# Patient Record
Sex: Female | Born: 1990 | Race: Black or African American | Hispanic: No | Marital: Single | Smoking: Never smoker
Health system: Southern US, Community
[De-identification: ages and names within clinical notes are randomized; demographics above are authoritative.]

## PROBLEM LIST (undated history)

## (undated) ENCOUNTER — Ambulatory Visit

## (undated) DIAGNOSIS — E7800 Pure hypercholesterolemia, unspecified: Secondary | ICD-10-CM

## (undated) DIAGNOSIS — D573 Sickle-cell trait: Secondary | ICD-10-CM

## (undated) HISTORY — DX: Pure hypercholesterolemia, unspecified: E78.00

## (undated) HISTORY — DX: Sickle-cell trait: D57.3

---

## 2015-04-24 DIAGNOSIS — O139 Gestational [pregnancy-induced] hypertension without significant proteinuria, unspecified trimester: Secondary | ICD-10-CM

## 2017-03-17 HISTORY — PX: LIPOSUCTION: SHX10

## 2019-01-28 ENCOUNTER — Encounter (HOSPITAL_COMMUNITY): Payer: Self-pay | Admitting: Emergency Medicine

## 2019-01-28 ENCOUNTER — Other Ambulatory Visit: Payer: Self-pay

## 2019-01-28 ENCOUNTER — Ambulatory Visit (HOSPITAL_COMMUNITY)
Admission: EM | Admit: 2019-01-28 | Discharge: 2019-01-28 | Disposition: A | Discharge status: Home / Self Care | Payer: BC Managed Care – PPO | Attending: Urgent Care | Admitting: Urgent Care

## 2019-01-28 DIAGNOSIS — L03113 Cellulitis of right upper limb: Secondary | ICD-10-CM | POA: Diagnosis not present

## 2019-01-28 DIAGNOSIS — M25521 Pain in right elbow: Secondary | ICD-10-CM

## 2019-01-28 MED ORDER — NAPROXEN 500 MG PO TABS: 500.0000 mg | ORAL_TABLET | Freq: Two times a day (BID) | ORAL | 0 refills | Status: DC

## 2019-01-28 MED ORDER — DOXYCYCLINE HYCLATE 100 MG PO CAPS: 100.0000 mg | ORAL_CAPSULE | Freq: Two times a day (BID) | ORAL | 0 refills | Status: DC

## 2019-01-28 NOTE — ED Provider Notes (Signed)
  [place]   MRN: [id number] DOB: [birth year]  Subjective:   Sheri Gilbert is a 28 y.o. female presenting for several day history of right elbow pain, redness and swelling.  Believes that she either hit her elbow or suffered an insect sting while she was moving stuff.  She has not tried any medications for relief.  Denies taking chronic medications.    No Known Allergies  History reviewed. No pertinent past medical history.   Past Surgical History:  Procedure Laterality Date  . CESAREAN SECTION      No family history on file.  Social History   Tobacco Use  . Smoking status: Never Smoker  . Smokeless tobacco: Never Used  Substance Use Topics  . Alcohol use: Not Currently  . Drug use: Not Currently    ROS   Objective:   Vitals: BP 113/82   Pulse 89   Temp 98 F (36.7 C) (Temporal)   Resp 16   SpO2 100%   Physical Exam Constitutional:      General: She is not in acute distress.    Appearance: Normal appearance. She is well-developed. She is not ill-appearing.  HENT:     Head: Normocephalic and atraumatic.     Nose: Nose normal.     Mouth/Throat:     Mouth: Mucous membranes are moist.     Pharynx: Oropharynx is clear.  Eyes:     General: No scleral icterus.    Extraocular Movements: Extraocular movements intact.     Pupils: Pupils are equal, round, and reactive to light.  Cardiovascular:     Rate and Rhythm: Normal rate.  Pulmonary:     Effort: Pulmonary effort is normal.  Musculoskeletal:     Right elbow: She exhibits swelling. She exhibits normal range of motion, no effusion, no deformity and no laceration. Tenderness (over area depicted) found. No radial head, no medial epicondyle, no lateral epicondyle and no olecranon process tenderness noted.       Arms:  Skin:    General: Skin is warm and dry.  Neurological:     General: No focal deficit present.     Mental Status: She is alert and oriented to person, place, and time.   Psychiatric:        Mood and Affect: Mood normal.        Behavior: Behavior normal.      Assessment and Plan :   1. Cellulitis of right elbow   2. Right elbow pain     Will cover for cellulitis with doxycycline, use Naprosyn for pain and inflammation. Counseled patient on potential for adverse effects with medications prescribed/recommended today, ER and return-to-clinic precautions discussed, patient verbalized understanding.    Jaynee Eagles, PA-C 01/28/19 1729

## 2019-01-28 NOTE — ED Triage Notes (Signed)
PT has suspected insect bite to right elbow.

## 2019-01-28 NOTE — Discharge Instructions (Signed)
Please start taking doxycycline to address infection of your right elbow skin. Use warm compresses 3-5 times a day for 5-10 minutes for at least the next 3 days.

## 2019-03-27 ENCOUNTER — Emergency Department (HOSPITAL_COMMUNITY)
Admission: EM | Admit: 2019-03-27 | Discharge: 2019-03-27 | Disposition: A | Discharge status: Home / Self Care | Payer: BC Managed Care – PPO | Attending: Emergency Medicine | Admitting: Emergency Medicine

## 2019-03-27 ENCOUNTER — Emergency Department (HOSPITAL_COMMUNITY)
Admission: EM | Admit: 2019-03-27 | Discharge: 2019-03-27 | Disposition: A | Discharge status: Home / Self Care | Payer: Self-pay | Attending: Emergency Medicine | Admitting: Emergency Medicine

## 2019-03-27 ENCOUNTER — Encounter (HOSPITAL_COMMUNITY): Payer: Self-pay | Admitting: Emergency Medicine

## 2019-03-27 ENCOUNTER — Other Ambulatory Visit: Payer: Self-pay

## 2019-03-27 ENCOUNTER — Emergency Department (HOSPITAL_COMMUNITY): Payer: Self-pay

## 2019-03-27 DIAGNOSIS — R1012 Left upper quadrant pain: Secondary | ICD-10-CM | POA: Insufficient documentation

## 2019-03-27 DIAGNOSIS — R109 Unspecified abdominal pain: Secondary | ICD-10-CM | POA: Insufficient documentation

## 2019-03-27 DIAGNOSIS — Z5321 Procedure and treatment not carried out due to patient leaving prior to being seen by health care provider: Secondary | ICD-10-CM | POA: Insufficient documentation

## 2019-03-27 DIAGNOSIS — Z79899 Other long term (current) drug therapy: Secondary | ICD-10-CM | POA: Insufficient documentation

## 2019-03-27 LAB — CBC WITH DIFFERENTIAL/PLATELET
Abs Immature Granulocytes: 0.06 10*3/uL (ref 0.00–0.07)
Basophils Absolute: 0 10*3/uL (ref 0.0–0.1)
Basophils Relative: 0 %
Eosinophils Absolute: 0.2 10*3/uL (ref 0.0–0.5)
Eosinophils Relative: 2 %
HCT: 37.4 % (ref 36.0–46.0)
Hemoglobin: 12.1 g/dL (ref 12.0–15.0)
Immature Granulocytes: 1 %
Lymphocytes Relative: 24 %
Lymphs Abs: 2.8 10*3/uL (ref 0.7–4.0)
MCH: 28.1 pg (ref 26.0–34.0)
MCHC: 32.4 g/dL (ref 30.0–36.0)
MCV: 87 fL (ref 80.0–100.0)
Monocytes Absolute: 0.6 10*3/uL (ref 0.1–1.0)
Monocytes Relative: 6 %
Neutro Abs: 7.8 10*3/uL — ABNORMAL HIGH (ref 1.7–7.7)
Neutrophils Relative %: 67 %
Platelets: 303 10*3/uL (ref 150–400)
RBC: 4.3 MIL/uL (ref 3.87–5.11)
RDW: 12.6 % (ref 11.5–15.5)
WBC: 11.5 10*3/uL — ABNORMAL HIGH (ref 4.0–10.5)
nRBC: 0 % (ref 0.0–0.2)

## 2019-03-27 LAB — COMPREHENSIVE METABOLIC PANEL
ALT: 16 U/L (ref 0–44)
AST: 15 U/L (ref 15–41)
Albumin: 3.8 g/dL (ref 3.5–5.0)
Alkaline Phosphatase: 86 U/L (ref 38–126)
Anion gap: 9 (ref 5–15)
BUN: 8 mg/dL (ref 6–20)
CO2: 25 mmol/L (ref 22–32)
Calcium: 9.4 mg/dL (ref 8.9–10.3)
Chloride: 104 mmol/L (ref 98–111)
Creatinine, Ser: 0.68 mg/dL (ref 0.44–1.00)
GFR calc Af Amer: >60 mL/min (ref >60)
GFR calc non Af Amer: >60 mL/min (ref >60)
Glucose, Bld: 98 mg/dL (ref 70–99)
Potassium: 3.9 mmol/L (ref 3.5–5.1)
Sodium: 138 mmol/L (ref 135–145)
Total Bilirubin: 0.4 mg/dL (ref 0.3–1.2)
Total Protein: 7.3 g/dL (ref 6.5–8.1)

## 2019-03-27 LAB — URINALYSIS, ROUTINE W REFLEX MICROSCOPIC
Bilirubin Urine: NEGATIVE
Glucose, UA: NEGATIVE mg/dL
Hgb urine dipstick: NEGATIVE
Ketones, ur: NEGATIVE mg/dL
Leukocytes,Ua: NEGATIVE
Nitrite: NEGATIVE
Protein, ur: NEGATIVE mg/dL
Specific Gravity, Urine: 1.013 (ref 1.005–1.030)
pH: 6 (ref 5.0–8.0)

## 2019-03-27 LAB — I-STAT BETA HCG BLOOD, ED (MC, WL, AP ONLY): I-stat hCG, quantitative: <5 m[IU]/mL (ref <5)

## 2019-03-27 LAB — LIPASE, BLOOD: Lipase: 22 U/L (ref 11–51)

## 2019-03-27 MED ORDER — ESOMEPRAZOLE MAGNESIUM 40 MG PO CPDR: 40.0000 mg | DELAYED_RELEASE_CAPSULE | Freq: Every day | ORAL | 0 refills | Status: DC

## 2019-03-27 MED ORDER — ALUM & MAG HYDROXIDE-SIMETH 200-200-20 MG/5ML PO SUSP
30.0000 mL | Freq: Once | ORAL | Status: AC
  Administered 2019-03-27: 22:00:00 30 mL via ORAL
  Filled 2019-03-27: qty 30

## 2019-03-27 MED ORDER — LIDOCAINE VISCOUS HCL 2 % MT SOLN
15.0000 mL | Freq: Once | OROMUCOSAL | Status: AC
  Administered 2019-03-27: 15 mL via ORAL
  Filled 2019-03-27: qty 15

## 2019-03-27 NOTE — ED Provider Notes (Signed)
[facility]-EMERGENCY DEPT Provider Note   CSN: [id number] Arrival date & time: 03/27/19  2025     History Chief Complaint  Patient presents with  . Abdominal Pain    Sheri Gilbert is a 29 y.o. female.  29 year old female presents with complaint of left-sided abdominal pain onset yesterday, radiates to left back, similar pain intermittent for the past 3 years.  Associated with nausea, denies vomiting.  Denies fevers, chills, changes in bowel or bladder habits.  Patient states that she is been seen for this on multiple occasions and been told that it is constipation related.  Patient states that she is constipated at times but does not feel like this is what is causing her pain.  Has not been seen by GI.  No other complaints or concerns.        History reviewed. No pertinent past medical history.  There are no problems to display for this patient.   Past Surgical History:  Procedure Laterality Date  . CESAREAN SECTION       OB History   No obstetric history on file.     History reviewed. No pertinent family history.  Social History   Tobacco Use  . Smoking status: Never Smoker  . Smokeless tobacco: Never Used  Substance Use Topics  . Alcohol use: Not Currently  . Drug use: Not Currently    Home Medications Prior to Admission medications   Medication Sig Start Date End Date Taking? Authorizing Provider  doxycycline (VIBRAMYCIN) 100 MG capsule Take 1 capsule (100 mg total) by mouth 2 (two) times daily. 01/28/19   Wallis Bamberg, PA-C  esomeprazole (NEXIUM) 40 MG capsule Take 1 capsule (40 mg total) by mouth daily. 03/27/19   Jeannie Fend, PA-C  naproxen (NAPROSYN) 500 MG tablet Take 1 tablet (500 mg total) by mouth 2 (two) times daily. 01/28/19   Wallis Bamberg, PA-C    Allergies    Patient has no known allergies.  Review of Systems   Review of Systems  Constitutional: Negative for chills and fever.  Respiratory: Negative for shortness of  breath.   Cardiovascular: Negative for chest pain.  Gastrointestinal: Positive for abdominal pain and nausea. Negative for constipation, diarrhea and vomiting.  Genitourinary: Negative for dysuria, frequency and vaginal discharge.  Musculoskeletal: Positive for back pain.  Skin: Negative for rash and wound.  Neurological: Negative for weakness.  Hematological: Negative for adenopathy.  Psychiatric/Behavioral: Negative for confusion.  All other systems reviewed and are negative.   Physical Exam Updated Vital Signs BP 116/83 (BP Location: Right Arm)   Pulse 89   Temp 98.3 F (36.8 C) (Oral)   Resp 16   Ht 5\' 2"  (1.575 m)   Wt 73.9 kg   SpO2 100%   BMI 29.81 kg/m   Physical Exam Vitals and nursing note reviewed.  Constitutional:      General: She is not in acute distress.    Appearance: She is well-developed. She is not diaphoretic.  HENT:     Head: Normocephalic and atraumatic.  Cardiovascular:     Rate and Rhythm: Normal rate and regular rhythm.     Heart sounds: Normal heart sounds.  Pulmonary:     Effort: Pulmonary effort is normal.     Breath sounds: Normal breath sounds.  Abdominal:     Palpations: Abdomen is soft.     Tenderness: There is abdominal tenderness in the left upper quadrant. There is no right CVA tenderness or left CVA tenderness.  Skin:  General: Skin is warm and dry.     Findings: No rash.  Neurological:     Mental Status: She is alert and oriented to person, place, and time.  Psychiatric:        Behavior: Behavior normal.     ED Results / Procedures / Treatments   Labs (all labs ordered are listed, but only abnormal results are displayed) Labs Reviewed  CBC WITH DIFFERENTIAL/PLATELET - Abnormal; Notable for the following components:      Result Value   WBC 11.5 (*)    Neutro Abs 7.8 (*)    All other components within normal limits  COMPREHENSIVE METABOLIC PANEL  LIPASE, BLOOD  URINALYSIS, ROUTINE W REFLEX MICROSCOPIC  I-STAT BETA HCG  BLOOD, ED ([facility], [facility], [facility] ONLY)    EKG None  Radiology DG Abd Acute W/Chest  Result Date: 03/27/2019 CLINICAL DATA:  29 year old female with nausea and abdominal pain for several hours. EXAM: DG ABDOMEN ACUTE W/ 1V CHEST COMPARISON:  None. FINDINGS: PA view of the chest. Normal cardiac size and mediastinal contours. Visualized tracheal air column is within normal limits. Both lungs appear clear. No pneumothorax or pleural effusion. Upright and supine views of the abdomen. Non obstructed bowel gas pattern. Abdominal and pelvic visceral contours are within normal limits. Mild dextroconvex thoracolumbar scoliosis. No acute osseous abnormality identified. IMPRESSION: 1. Normal bowel gas pattern, no free air. 2. No cardiopulmonary abnormality. Electronically Signed   By: Odessa Fleming M.D.   On: 03/27/2019 22:03    Procedures Procedures (including critical care time)  Medications Ordered in ED Medications  alum & mag hydroxide-simeth (MAALOX/MYLANTA) 200-200-20 MG/5ML suspension 30 mL (30 mLs Oral Given 03/27/19 2207)    And  lidocaine (XYLOCAINE) 2 % viscous mouth solution 15 mL (15 mLs Oral Given 03/27/19 2207)    ED Course  I have reviewed the triage vital signs and the nursing notes.  Pertinent labs & imaging results that were available during my care of the patient were reviewed by me and considered in my medical decision making (see chart for details).  Clinical Course as of Mar 26 2228  Sun Mar 27, 2019  8037 29 year old female with complaint of left-sided abdominal pain off and on for the past 2 to 3 years.  Patient states that she has been seen multiple times and told that this is related to constipation however she continues to have intermittent pain.  Review of records, unable to find any prior visits for this patient.  Patient is well-appearing, mild tenderness in the left upper quadrant.  Patient reports pain after eating.  X-ray abdominal series is unremarkable, lipase, CMP, urinalysis, hCG  negative are within normal limits.  Urinalysis with mild leukocytosis of 11,000 which is nonspecific. Patient be given prescription for Nexium, referral to GI due to duration of her ongoing abdominal pain.  Advised to follow-up with GI or see her PCP.   [LM]    Clinical Course User Index [LM] Alden Hipp   MDM Rules/Calculators/A&P                      Final Clinical Impression(s) / ED Diagnoses Final diagnoses:  Left upper quadrant abdominal pain    Rx / DC Orders ED Discharge Orders         Ordered    esomeprazole (NEXIUM) 40 MG capsule  Daily     03/27/19 2227           Jeannie Fend, PA-C 03/27/19  2230    Carmin Muskrat, MD 03/30/19 [phone]

## 2019-03-27 NOTE — ED Notes (Signed)
Pt leaving AMA. Advised pt to return if symptoms worsen. 

## 2019-03-27 NOTE — ED Triage Notes (Signed)
PT reports having nausea and generalized abdominal pain for the last several hours. Pt denies any vomiting or diarrhea.

## 2019-03-27 NOTE — Discharge Instructions (Addendum)
Your work-up today in the emergency room is reassuring.  Your blood work, urine test and x-ray are all normal.  As we discussed, these tests do not show things such as ulcers in the stomach that may cause pain.  You should take Nexium daily as prescribed.  Recommend follow-up with your doctor or see gastroenterology, referral given.

## 2019-03-27 NOTE — ED Notes (Signed)
Pt requesting pain medication, ED PA made aware.

## 2019-03-27 NOTE — ED Notes (Signed)
Pt ambulatory to and from bathroom with a steady gait. No assistance required.

## 2019-05-11 ENCOUNTER — Other Ambulatory Visit: Payer: Self-pay | Admitting: Gastroenterology

## 2019-05-11 DIAGNOSIS — R1010 Upper abdominal pain, unspecified: Secondary | ICD-10-CM

## 2019-05-20 ENCOUNTER — Other Ambulatory Visit: Payer: Medicaid Other

## 2019-07-04 ENCOUNTER — Ambulatory Visit: Payer: Medicaid Other | Attending: Internal Medicine

## 2019-07-04 DIAGNOSIS — Z20822 Contact with and (suspected) exposure to covid-19: Secondary | ICD-10-CM

## 2019-07-05 LAB — SARS-COV-2, NAA 2 DAY TAT

## 2019-07-05 LAB — NOVEL CORONAVIRUS, NAA: SARS-CoV-2, NAA: NOT DETECTED

## 2019-07-15 ENCOUNTER — Ambulatory Visit: Payer: Medicaid Other | Admitting: Family Medicine

## 2019-08-26 ENCOUNTER — Ambulatory Visit
Admission: RE | Admit: 2019-08-26 | Discharge: 2019-08-26 | Disposition: A | Discharge status: Home / Self Care | Payer: Medicaid Other | Source: Ambulatory Visit | Attending: Gastroenterology | Admitting: Gastroenterology

## 2019-08-26 DIAGNOSIS — R1010 Upper abdominal pain, unspecified: Secondary | ICD-10-CM

## 2019-09-20 ENCOUNTER — Other Ambulatory Visit: Payer: Self-pay

## 2019-09-20 ENCOUNTER — Encounter (HOSPITAL_COMMUNITY): Payer: Self-pay | Admitting: Emergency Medicine

## 2019-09-20 ENCOUNTER — Observation Stay (HOSPITAL_COMMUNITY)
Admission: EM | Admit: 2019-09-20 | Discharge: 2019-09-21 | Disposition: A | Discharge status: Home / Self Care | Payer: 59 | Attending: General Surgery | Admitting: General Surgery

## 2019-09-20 ENCOUNTER — Emergency Department (HOSPITAL_COMMUNITY): Payer: 59

## 2019-09-20 DIAGNOSIS — Z79899 Other long term (current) drug therapy: Secondary | ICD-10-CM | POA: Insufficient documentation

## 2019-09-20 DIAGNOSIS — K219 Gastro-esophageal reflux disease without esophagitis: Secondary | ICD-10-CM | POA: Insufficient documentation

## 2019-09-20 DIAGNOSIS — Z20822 Contact with and (suspected) exposure to covid-19: Secondary | ICD-10-CM | POA: Diagnosis not present

## 2019-09-20 DIAGNOSIS — K8012 Calculus of gallbladder with acute and chronic cholecystitis without obstruction: Principal | ICD-10-CM | POA: Insufficient documentation

## 2019-09-20 DIAGNOSIS — K819 Cholecystitis, unspecified: Secondary | ICD-10-CM

## 2019-09-20 DIAGNOSIS — R109 Unspecified abdominal pain: Secondary | ICD-10-CM

## 2019-09-20 DIAGNOSIS — R1011 Right upper quadrant pain: Secondary | ICD-10-CM | POA: Diagnosis present

## 2019-09-20 DIAGNOSIS — K8010 Calculus of gallbladder with chronic cholecystitis without obstruction: Secondary | ICD-10-CM | POA: Diagnosis present

## 2019-09-20 LAB — URINALYSIS, ROUTINE W REFLEX MICROSCOPIC
Bilirubin Urine: NEGATIVE
Glucose, UA: NEGATIVE mg/dL
Ketones, ur: NEGATIVE mg/dL
Leukocytes,Ua: NEGATIVE
Nitrite: NEGATIVE
Protein, ur: NEGATIVE mg/dL
Specific Gravity, Urine: 1.014 (ref 1.005–1.030)
pH: 5 (ref 5.0–8.0)

## 2019-09-20 LAB — COMPREHENSIVE METABOLIC PANEL
ALT: 13 U/L (ref 0–44)
AST: 17 U/L (ref 15–41)
Albumin: 3.6 g/dL (ref 3.5–5.0)
Alkaline Phosphatase: 89 U/L (ref 38–126)
Anion gap: 8 (ref 5–15)
BUN: 8 mg/dL (ref 6–20)
CO2: 23 mmol/L (ref 22–32)
Calcium: 9 mg/dL (ref 8.9–10.3)
Chloride: 106 mmol/L (ref 98–111)
Creatinine, Ser: 0.77 mg/dL (ref 0.44–1.00)
GFR calc Af Amer: >60 mL/min (ref >60)
GFR calc non Af Amer: >60 mL/min (ref >60)
Glucose, Bld: 101 mg/dL — ABNORMAL HIGH (ref 70–99)
Potassium: 3.9 mmol/L (ref 3.5–5.1)
Sodium: 137 mmol/L (ref 135–145)
Total Bilirubin: 0.8 mg/dL (ref 0.3–1.2)
Total Protein: 6.6 g/dL (ref 6.5–8.1)

## 2019-09-20 LAB — CBC
HCT: 37.7 % (ref 36.0–46.0)
Hemoglobin: 11.8 g/dL — ABNORMAL LOW (ref 12.0–15.0)
MCH: 28.3 pg (ref 26.0–34.0)
MCHC: 31.3 g/dL (ref 30.0–36.0)
MCV: 90.4 fL (ref 80.0–100.0)
Platelets: 307 10*3/uL (ref 150–400)
RBC: 4.17 MIL/uL (ref 3.87–5.11)
RDW: 12.7 % (ref 11.5–15.5)
WBC: 9.9 10*3/uL (ref 4.0–10.5)
nRBC: 0 % (ref 0.0–0.2)

## 2019-09-20 LAB — I-STAT BETA HCG BLOOD, ED (MC, WL, AP ONLY): I-stat hCG, quantitative: <5 m[IU]/mL (ref <5)

## 2019-09-20 LAB — LIPASE, BLOOD: Lipase: 26 U/L (ref 11–51)

## 2019-09-20 LAB — SARS CORONAVIRUS 2 BY RT PCR (HOSPITAL ORDER, PERFORMED IN ~~LOC~~ HOSPITAL LAB): SARS Coronavirus 2: NEGATIVE

## 2019-09-20 LAB — HIV ANTIBODY (ROUTINE TESTING W REFLEX): HIV Screen 4th Generation wRfx: NONREACTIVE

## 2019-09-20 MED ORDER — PANTOPRAZOLE SODIUM 40 MG PO TBEC
40.0000 mg | DELAYED_RELEASE_TABLET | Freq: Every day | ORAL | Status: DC
  Administered 2019-09-20 – 2019-09-21 (×2): 40 mg via ORAL
  Filled 2019-09-20 (×2): qty 1

## 2019-09-20 MED ORDER — DIPHENHYDRAMINE HCL 50 MG/ML IJ SOLN: 12.5000 mg | Freq: Four times a day (QID) | INTRAMUSCULAR | Status: DC | PRN

## 2019-09-20 MED ORDER — MORPHINE SULFATE (PF) 2 MG/ML IV SOLN: 2.0000 mg | INTRAVENOUS | Status: DC | PRN

## 2019-09-20 MED ORDER — CEFAZOLIN SODIUM-DEXTROSE 2-4 GM/100ML-% IV SOLN
2.0000 g | INTRAVENOUS | Status: AC
  Administered 2019-09-21: 2 g via INTRAVENOUS
  Filled 2019-09-20 (×2): qty 100

## 2019-09-20 MED ORDER — DOCUSATE SODIUM 100 MG PO CAPS
100.0000 mg | ORAL_CAPSULE | Freq: Two times a day (BID) | ORAL | Status: DC
  Administered 2019-09-20 (×2): 100 mg via ORAL
  Filled 2019-09-20 (×2): qty 1

## 2019-09-20 MED ORDER — KCL IN DEXTROSE-NACL 20-5-0.45 MEQ/L-%-% IV SOLN
INTRAVENOUS | Status: DC
  Filled 2019-09-20 (×3): qty 1000

## 2019-09-20 MED ORDER — OXYCODONE HCL 5 MG PO TABS
5.0000 mg | ORAL_TABLET | ORAL | Status: DC | PRN
  Administered 2019-09-20 – 2019-09-21 (×2): 5 mg via ORAL
  Filled 2019-09-20 (×2): qty 1

## 2019-09-20 MED ORDER — ONDANSETRON HCL 4 MG/2ML IJ SOLN
4.0000 mg | Freq: Four times a day (QID) | INTRAMUSCULAR | Status: DC | PRN
  Administered 2019-09-21: 4 mg via INTRAVENOUS
  Filled 2019-09-20: qty 2

## 2019-09-20 MED ORDER — SODIUM CHLORIDE 0.9% FLUSH
3.0000 mL | Freq: Once | INTRAVENOUS | Status: AC
  Administered 2019-09-20: 3 mL via INTRAVENOUS

## 2019-09-20 MED ORDER — ACETAMINOPHEN 650 MG RE SUPP: 650.0000 mg | Freq: Four times a day (QID) | RECTAL | Status: DC | PRN

## 2019-09-20 MED ORDER — DIPHENHYDRAMINE HCL 12.5 MG/5ML PO ELIX: 12.5000 mg | ORAL_SOLUTION | Freq: Four times a day (QID) | ORAL | Status: DC | PRN

## 2019-09-20 MED ORDER — IBUPROFEN 400 MG PO TABS: 600.0000 mg | ORAL_TABLET | Freq: Four times a day (QID) | ORAL | Status: DC | PRN

## 2019-09-20 MED ORDER — ONDANSETRON HCL 4 MG/2ML IJ SOLN
4.0000 mg | Freq: Once | INTRAMUSCULAR | Status: AC
  Administered 2019-09-20: 4 mg via INTRAVENOUS
  Filled 2019-09-20: qty 2

## 2019-09-20 MED ORDER — ONDANSETRON 4 MG PO TBDP: 4.0000 mg | ORAL_TABLET | Freq: Four times a day (QID) | ORAL | Status: DC | PRN

## 2019-09-20 MED ORDER — FENTANYL CITRATE (PF) 100 MCG/2ML IJ SOLN
50.0000 ug | Freq: Once | INTRAMUSCULAR | Status: AC
  Administered 2019-09-20: 50 ug via INTRAVENOUS
  Filled 2019-09-20: qty 2

## 2019-09-20 MED ORDER — ACETAMINOPHEN 325 MG PO TABS: 650.0000 mg | ORAL_TABLET | Freq: Four times a day (QID) | ORAL | Status: DC | PRN

## 2019-09-20 NOTE — Progress Notes (Signed)
Patient arrived to room 5C16 in NAD, VS stable. Patient oriented to room.

## 2019-09-20 NOTE — H&P (Addendum)
[facility] Admission Note  Sheri Gilbert May 05, 1990  [id number].    Requesting MD: Rubin Payor, MD Chief Complaint/Reason for Consult: symptomatic cholelithiasis, possible cholecystitis HPI:  Sheri Gilbert is a 29 y/o F who presented to the ED today with a cc acute onset RUQ abdominal pain. Patient reports eating baked pork chops for dinner, going to bed, and then woke up at 0200 with severe, 10/10, non-radiating RUQ pain associated with nausea and vomiting. She reports a history of similar, but less severe, pain in the past. Reports episodes of pain started about 7 years ago. Initially they were only 1-2 times per year but have progressed to episodes of RUQ pain 1-2 times weekly. Patient reports a history of low iron for which she used to takes supplements for but has not taken them in a while. Denies smoking, alcohol, or illicit drug use. She lives at home with her 82 and 5 year-old daughters. Works from home. States she has had 2 c-sections, one of which was complicated by bleeding requiring transfusion. No other history of abdominal surgeries. NKDA. Denies possibility of being pregnant currently - has a nexplanon.   ROS: Review of Systems  All other systems reviewed and are negative.   No family history on file.  History reviewed. No pertinent past medical history.  Past Surgical History:  Procedure Laterality Date  . CESAREAN SECTION      Social History:  reports that she has never smoked. She has never used smokeless tobacco. She reports previous alcohol use. She reports previous drug use.  Allergies: No Known Allergies  (Not in a hospital admission)   Blood pressure 114/74, pulse 85, temperature 98.2 F (36.8 C), resp. rate 15, height 5\' 2"  (1.575 m), weight 74.8 kg, SpO2 96 %. Physical Exam: Constitutional: NAD; conversant; no deformities Eyes: Moist conjunctiva; no lid lag; anicteric; PERRL Neck: Trachea midline; no thyromegaly Lungs: Normal respiratory  effort; no tactile fremitus CV: RRR; no palpable thrills; no pitting edema GI: Abd soft, TTP RUQ with mild guarding, no lower abdominal tenderness no palpable hepatosplenomegaly MSK: Normal gait; no clubbing/cyanosis Psychiatric: Appropriate affect; alert and oriented x3 Lymphatic: No palpable cervical or axillary lymphadenopathy  Results for orders placed or performed during the hospital encounter of 09/20/19 (from the past 48 hour(s))  Lipase, blood     Status: None   Collection Time: 09/20/19  7:20 AM  Result Value Ref Range   Lipase 26 11 - 51 U/L    Comment: Performed at [facility], [address] [place], [place]  Comprehensive metabolic panel     Status: Abnormal   Collection Time: 09/20/19  7:20 AM  Result Value Ref Range   Sodium 137 135 - 145 mmol/L   Potassium 3.9 3.5 - 5.1 mmol/L   Chloride 106 98 - 111 mmol/L   CO2 23 22 - 32 mmol/L   Glucose, Bld 101 (H) 70 - 99 mg/dL    Comment: Glucose reference range applies only to samples taken after fasting for at least 8 hours.   BUN 8 6 - 20 mg/dL   Creatinine, Ser 11/21/19 0.44 - 1.00 mg/dL   Calcium 9.0 8.9 - 8.10 mg/dL   Total Protein 6.6 6.5 - 8.1 g/dL   Albumin 3.6 3.5 - 5.0 g/dL   AST 17 15 - 41 U/L   ALT 13 0 - 44 U/L   Alkaline Phosphatase 89 38 - 126 U/L   Total Bilirubin 0.8 0.3 - 1.2 mg/dL   GFR  calc non Af Amer >60 >60 mL/min   GFR calc Af Amer >60 >60 mL/min   Anion gap 8 5 - 15    Comment: Performed at [facility], [address], [place], [place] [address]  CBC     Status: Abnormal   Collection Time: 09/20/19  7:20 AM  Result Value Ref Range   WBC 9.9 4.0 - 10.5 K/uL   RBC 4.17 3.87 - 5.11 MIL/uL   Hemoglobin 11.8 (L) 12.0 - 15.0 g/dL   HCT 60.4 36 - 46 %   MCV 90.4 80.0 - 100.0 fL   MCH 28.3 26.0 - 34.0 pg   MCHC 31.3 30.0 - 36.0 g/dL   RDW 54.0 98.1 - 19.1 %   Platelets 307 150 - 400 K/uL   nRBC 0.0 0.0 - 0.2 %    Comment: Performed at [facility], [address], [place], [place] [address]  Urinalysis, Routine w reflex microscopic     Status: Abnormal   Collection Time: 09/20/19  8:09 AM  Result Value Ref Range   Color, Urine YELLOW YELLOW   APPearance HAZY (A) CLEAR   Specific Gravity, Urine 1.014 1.005 - 1.030   pH 5.0 5.0 - 8.0   Glucose, UA NEGATIVE NEGATIVE mg/dL   Hgb urine dipstick SMALL (A) NEGATIVE   Bilirubin Urine NEGATIVE NEGATIVE   Ketones, ur NEGATIVE NEGATIVE mg/dL   Protein, ur NEGATIVE NEGATIVE mg/dL   Nitrite NEGATIVE NEGATIVE   Leukocytes,Ua NEGATIVE NEGATIVE   RBC / HPF 0-5 0 - 5 RBC/hpf   WBC, UA 0-5 0 - 5 WBC/hpf   Bacteria, UA MANY (A) NONE SEEN   Squamous Epithelial / LPF 21-50 0 - 5   Mucus PRESENT     Comment: Performed at [facility], [address], [place], [place] [address]  I-Stat beta hCG blood, ED     Status: None   Collection Time: 09/20/19  9:21 AM  Result Value Ref Range   I-stat hCG, quantitative <5.0 <5 mIU/mL   Comment 3            Comment:   GEST. AGE      CONC.  (mIU/mL)   <=1 WEEK        5 - 50     2 WEEKS       50 - 500     3 WEEKS       100 - 10,000     4 WEEKS     1,000 - 30,000        FEMALE AND NON-PREGNANT FEMALE:     LESS THAN 5 mIU/mL    US Abdomen Limited RUQ  Result Date: 09/20/2019 CLINICAL DATA:  Abdominal pain EXAM: ULTRASOUND ABDOMEN LIMITED RIGHT UPPER QUADRANT COMPARISON:  August 26, 2018 FINDINGS: Gallbladder: Gallbladder is somewhat distended. There is sludge throughout the gallbladder. There are intermingled gallstones, largest measuring 1.6 cm in length. There is no appreciable gallbladder wall thickening or pericholecystic fluid. No sonographic Murphy sign noted by sonographer. Common bile duct: Diameter: 5 mm. No intrahepatic or extrahepatic biliary duct dilatation. Liver: No focal lesion identified. Within normal limits in parenchymal echogenicity. Portal vein is patent on color Doppler imaging with normal direction of blood flow towards the liver. Other: None. IMPRESSION:  Gallbladder distended with sludge and cholelithiasis. No appreciable gallbladder wall thickening or pericholecystic fluid. This appearance of the gallbladder raises concern for potential degree of underlying cholecystitis. This finding may warrant nuclear medicine  hepatobiliary imaging study to assess for cystic duct patency. Study otherwise unremarkable. Electronically Signed   By: Bretta Bang III M.D.   On: 09/20/2019 09:58   Assessment/Plan GERD - on omeprazole at home.   Symptomatic cholelithiasis, possible early acute calculous cholecystitis  - afebrile, WBC WNL, LFTs WNL - RUQ U/S w/ multiple stones (largest 1.6 cm) and sludge, no evidence of cholecystitis  - due to increasing severity of biliary colic, possible early cholecystitis, I do not think this patient would be able to wait another 4-8 weeks to receive outpatient laparoscopic cholecystectomy. Recommend admission to [facility] service, NPO after MN for laparoscopic cholecystectomy tomorrow.  - we discussed the risks of surgery including bleeding, infection, conversion to open, damage to surrounding structures, need for additional procedures, cardiac and respiratory complications, and death. The patient is willing to proceed with surgery.   Adam Phenix, PA-C [facility] Please see Amion for pager number during day hours 7:00am-4:30pm 09/20/2019, 11:15 AM

## 2019-09-20 NOTE — ED Provider Notes (Signed)
[facility] EMERGENCY DEPARTMENT Provider Note   CSN: [id number] Arrival date & time: 09/20/19  3212     History Chief Complaint  Patient presents with   Abdominal Pain    Sheri Gilbert is a 29 y.o. female.  HPI Patient presents with epigastric to right upper quad abdominal pain.  Had been evaluated for the same somewhat recently and on 11 June had ultrasound showing gallstones by gastroenterology.  Reportedly is been attempted to get in to see general surgery this but has not been able to yet.  Starting last night began to have worsening pain along with nausea and vomiting.  Similar pain.  Has not eaten today.  No fevers.  Has not had her Covid vaccine.  Denies possibly of pregnancy.    History reviewed. No pertinent past medical history.  Patient Active Problem List   Diagnosis Date Noted   Cholecystitis with cholelithiasis 09/20/2019    Past Surgical History:  Procedure Laterality Date   CESAREAN SECTION       OB History   No obstetric history on file.     No family history on file.  Social History   Tobacco Use   Smoking status: Never Smoker   Smokeless tobacco: Never Used  Substance Use Topics   Alcohol use: Not Currently   Drug use: Not Currently    Home Medications Prior to Admission medications   Medication Sig Start Date End Date Taking? Authorizing Provider  etonogestrel (NEXPLANON) 68 MG IMPL implant 1 each by Subdermal route once.   Yes [provider]  omeprazole (PRILOSEC) 20 MG capsule Take 20 mg by mouth in the morning and at bedtime.   Yes [provider]  sucralfate (CARAFATE) 1 g tablet Take 1 g by mouth 4 (four) times daily.   Yes [provider]  doxycycline (VIBRAMYCIN) 100 MG capsule Take 1 capsule (100 mg total) by mouth 2 (two) times daily. Patient not taking: Reported on 09/20/2019 01/28/19   Wallis Bamberg, PA-C  esomeprazole (NEXIUM) 40 MG capsule Take 1 capsule (40 mg total) by mouth  daily. Patient not taking: Reported on 09/20/2019 03/27/19   Army Melia A, PA-C  naproxen (NAPROSYN) 500 MG tablet Take 1 tablet (500 mg total) by mouth 2 (two) times daily. Patient not taking: Reported on 09/20/2019 01/28/19   Wallis Bamberg, PA-C    Allergies    Patient has no known allergies.  Review of Systems   Review of Systems  Constitutional: Positive for appetite change. Negative for fever.  HENT: Negative for congestion.   Respiratory: Negative for shortness of breath.   Cardiovascular: Negative for chest pain.  Gastrointestinal: Positive for abdominal pain, nausea and vomiting.  Genitourinary: Negative for flank pain.  Musculoskeletal: Negative for arthralgias and back pain.  Skin: Negative for rash.  Neurological: Negative for weakness.    Physical Exam Updated Vital Signs BP 114/74    Pulse 85    Temp 98.2 F (36.8 C)    Resp 15    Ht 5\' 2"  (1.575 m)    Wt 74.8 kg    SpO2 96%    BMI 30.18 kg/m   Physical Exam Vitals and nursing note reviewed.  HENT:     Head: Atraumatic.  Cardiovascular:     Rate and Rhythm: Normal rate and regular rhythm.  Pulmonary:     Breath sounds: Normal breath sounds.  Abdominal:     Tenderness: There is abdominal tenderness in the right upper quadrant.  Comments: Right upper quadrant tenderness without rebound or guarding.  No hernia palpated.  Skin:    General: Skin is warm.     Capillary Refill: Capillary refill takes less than 2 seconds.  Neurological:     Mental Status: She is alert and oriented to person, place, and time.     ED Results / Procedures / Treatments   Labs (all labs ordered are listed, but only abnormal results are displayed) Labs Reviewed  COMPREHENSIVE METABOLIC PANEL - Abnormal; Notable for the following components:      Result Value   Glucose, Bld 101 (*)    All other components within normal limits  CBC - Abnormal; Notable for the following components:   Hemoglobin 11.8 (*)    All other components  within normal limits  URINALYSIS, ROUTINE W REFLEX MICROSCOPIC - Abnormal; Notable for the following components:   APPearance HAZY (*)    Hgb urine dipstick SMALL (*)    Bacteria, UA MANY (*)    All other components within normal limits  SARS CORONAVIRUS 2 BY RT PCR (HOSPITAL ORDER, PERFORMED IN [facility])  LIPASE, BLOOD  HIV ANTIBODY (ROUTINE TESTING W REFLEX)  I-STAT BETA HCG BLOOD, ED ([facility], [facility], [facility] ONLY)    EKG None  Radiology US Abdomen Limited RUQ  Result Date: 09/20/2019 CLINICAL DATA:  Abdominal pain EXAM: ULTRASOUND ABDOMEN LIMITED RIGHT UPPER QUADRANT COMPARISON:  August 26, 2018 FINDINGS: Gallbladder: Gallbladder is somewhat distended. There is sludge throughout the gallbladder. There are intermingled gallstones, largest measuring 1.6 cm in length. There is no appreciable gallbladder wall thickening or pericholecystic fluid. No sonographic Murphy sign noted by sonographer. Common bile duct: Diameter: 5 mm. No intrahepatic or extrahepatic biliary duct dilatation. Liver: No focal lesion identified. Within normal limits in parenchymal echogenicity. Portal vein is patent on color Doppler imaging with normal direction of blood flow towards the liver. Other: None. IMPRESSION: Gallbladder distended with sludge and cholelithiasis. No appreciable gallbladder wall thickening or pericholecystic fluid. This appearance of the gallbladder raises concern for potential degree of underlying cholecystitis. This finding may warrant nuclear medicine hepatobiliary imaging study to assess for cystic duct patency. Study otherwise unremarkable. Electronically Signed   By: Bretta Bang III M.D.   On: 09/20/2019 09:58    Procedures Procedures (including critical care time)  Medications Ordered in ED Medications  dextrose 5 % and 0.45 % NaCl with KCl 20 mEq/L infusion (has no administration in time range)  ceFAZolin (ANCEF) IVPB 2g/100 mL premix (has no administration in time range)    acetaminophen (TYLENOL) tablet 650 mg (has no administration in time range)    Or  acetaminophen (TYLENOL) suppository 650 mg (has no administration in time range)  ibuprofen (ADVIL) tablet 600 mg (has no administration in time range)  diphenhydrAMINE (BENADRYL) 12.5 MG/5ML elixir 12.5 mg (has no administration in time range)    Or  diphenhydrAMINE (BENADRYL) injection 12.5 mg (has no administration in time range)  oxyCODONE (Oxy IR/ROXICODONE) immediate release tablet 5 mg (has no administration in time range)  morphine 2 MG/ML injection 2 mg (has no administration in time range)  docusate sodium (COLACE) capsule 100 mg (has no administration in time range)  ondansetron (ZOFRAN-ODT) disintegrating tablet 4 mg (has no administration in time range)    Or  ondansetron (ZOFRAN) injection 4 mg (has no administration in time range)  pantoprazole (PROTONIX) EC tablet 40 mg (has no administration in time range)  sodium chloride flush (NS) 0.9 % injection 3 mL (  3 mLs Intravenous Given 09/20/19 0914)  ondansetron (ZOFRAN) injection 4 mg (4 mg Intravenous Given 09/20/19 0913)  fentaNYL (SUBLIMAZE) injection 50 mcg (50 mcg Intravenous Given 09/20/19 1028)    ED Course  I have reviewed the triage vital signs and the nursing notes.  Pertinent labs & imaging results that were available during my care of the patient were reviewed by me and considered in my medical decision making (see chart for details).    MDM Rules/Calculators/A&P                          Patient with abdominal pain.  Recurrent.  Known gallstones.  Began after eating fatty food last night.  Lab work reassuring.  Ultrasound showed potential early cholecystitis.  General surgery will be admitting. Final Clinical Impression(s) / ED Diagnoses Final diagnoses:  Abdominal pain  Cholecystitis    Rx / DC Orders ED Discharge Orders    None       Benjiman Core, MD 09/20/19 1126

## 2019-09-20 NOTE — ED Notes (Signed)
Off floor to US.

## 2019-09-20 NOTE — ED Triage Notes (Signed)
Pt reports recent diagnosis of gallstones, began having R sided abd pain and vomiting early this am, does not have appt with surgeon until august, taking carafate and bentyl without relief.

## 2019-09-21 ENCOUNTER — Encounter (HOSPITAL_COMMUNITY): Payer: Self-pay

## 2019-09-21 ENCOUNTER — Observation Stay (HOSPITAL_COMMUNITY): Payer: 59 | Admitting: Anesthesiology

## 2019-09-21 ENCOUNTER — Encounter (HOSPITAL_COMMUNITY)
Admission: EM | Disposition: A | Discharge status: Home / Self Care | Payer: Self-pay | Source: Home / Self Care | Attending: Emergency Medicine

## 2019-09-21 HISTORY — PX: CHOLECYSTECTOMY: SHX55

## 2019-09-21 LAB — COMPREHENSIVE METABOLIC PANEL
ALT: 13 U/L (ref 0–44)
AST: 11 U/L — ABNORMAL LOW (ref 15–41)
Albumin: 3 g/dL — ABNORMAL LOW (ref 3.5–5.0)
Alkaline Phosphatase: 71 U/L (ref 38–126)
Anion gap: 6 (ref 5–15)
BUN: <5 mg/dL — ABNORMAL LOW (ref 6–20)
CO2: 26 mmol/L (ref 22–32)
Calcium: 8.6 mg/dL — ABNORMAL LOW (ref 8.9–10.3)
Chloride: 107 mmol/L (ref 98–111)
Creatinine, Ser: 0.7 mg/dL (ref 0.44–1.00)
GFR calc Af Amer: >60 mL/min (ref >60)
GFR calc non Af Amer: >60 mL/min (ref >60)
Glucose, Bld: 99 mg/dL (ref 70–99)
Potassium: 4.1 mmol/L (ref 3.5–5.1)
Sodium: 139 mmol/L (ref 135–145)
Total Bilirubin: 0.6 mg/dL (ref 0.3–1.2)
Total Protein: 5.8 g/dL — ABNORMAL LOW (ref 6.5–8.1)

## 2019-09-21 LAB — CBC
HCT: 32.2 % — ABNORMAL LOW (ref 36.0–46.0)
Hemoglobin: 10.3 g/dL — ABNORMAL LOW (ref 12.0–15.0)
MCH: 27.8 pg (ref 26.0–34.0)
MCHC: 32 g/dL (ref 30.0–36.0)
MCV: 86.8 fL (ref 80.0–100.0)
Platelets: 262 10*3/uL (ref 150–400)
RBC: 3.71 MIL/uL — ABNORMAL LOW (ref 3.87–5.11)
RDW: 12.5 % (ref 11.5–15.5)
WBC: 8.7 10*3/uL (ref 4.0–10.5)
nRBC: 0 % (ref 0.0–0.2)

## 2019-09-21 LAB — SURGICAL PCR SCREEN
MRSA, PCR: NEGATIVE
Staphylococcus aureus: NEGATIVE

## 2019-09-21 LAB — GLUCOSE, CAPILLARY: Glucose-Capillary: 154 mg/dL — ABNORMAL HIGH (ref 70–99)

## 2019-09-21 SURGERY — LAPAROSCOPIC CHOLECYSTECTOMY
Anesthesia: General | Site: Abdomen

## 2019-09-21 MED ORDER — 0.9 % SODIUM CHLORIDE (POUR BTL) OPTIME
TOPICAL | Status: DC | PRN
  Administered 2019-09-21: 1000 mL

## 2019-09-21 MED ORDER — FENTANYL CITRATE (PF) 100 MCG/2ML IJ SOLN
INTRAMUSCULAR | Status: AC
  Administered 2019-09-21: 50 ug via INTRAVENOUS
  Filled 2019-09-21: qty 2

## 2019-09-21 MED ORDER — PHENYLEPHRINE HCL-NACL 10-0.9 MG/250ML-% IV SOLN
INTRAVENOUS | Status: DC | PRN
  Administered 2019-09-21: 30 ug/min via INTRAVENOUS

## 2019-09-21 MED ORDER — LACTATED RINGERS IV SOLN: INTRAVENOUS | Status: DC

## 2019-09-21 MED ORDER — DEXAMETHASONE SODIUM PHOSPHATE 10 MG/ML IJ SOLN
INTRAMUSCULAR | Status: DC | PRN
  Administered 2019-09-21: 4 mg via INTRAVENOUS

## 2019-09-21 MED ORDER — ORAL CARE MOUTH RINSE: 15.0000 mL | Freq: Once | OROMUCOSAL | Status: AC

## 2019-09-21 MED ORDER — ONDANSETRON HCL 4 MG/2ML IJ SOLN
INTRAMUSCULAR | Status: AC
  Filled 2019-09-21: qty 2

## 2019-09-21 MED ORDER — ACETAMINOPHEN 500 MG PO TABS
1000.0000 mg | ORAL_TABLET | ORAL | Status: AC
  Administered 2019-09-21: 1000 mg via ORAL
  Filled 2019-09-21: qty 2

## 2019-09-21 MED ORDER — ROCURONIUM BROMIDE 10 MG/ML (PF) SYRINGE
PREFILLED_SYRINGE | INTRAVENOUS | Status: DC | PRN
  Administered 2019-09-21: 50 mg via INTRAVENOUS
  Administered 2019-09-21: 10 mg via INTRAVENOUS

## 2019-09-21 MED ORDER — BUPIVACAINE HCL (PF) 0.25 % IJ SOLN
INTRAMUSCULAR | Status: DC | PRN
  Administered 2019-09-21: 14 mL

## 2019-09-21 MED ORDER — FENTANYL CITRATE (PF) 100 MCG/2ML IJ SOLN
INTRAMUSCULAR | Status: DC | PRN
  Administered 2019-09-21: 50 ug via INTRAVENOUS
  Administered 2019-09-21: 150 ug via INTRAVENOUS
  Administered 2019-09-21: 50 ug via INTRAVENOUS

## 2019-09-21 MED ORDER — BUPIVACAINE HCL (PF) 0.25 % IJ SOLN
INTRAMUSCULAR | Status: AC
  Filled 2019-09-21: qty 30

## 2019-09-21 MED ORDER — ACETAMINOPHEN 325 MG PO TABS
650.0000 mg | ORAL_TABLET | Freq: Four times a day (QID) | ORAL | Status: DC
  Filled 2019-09-21: qty 2

## 2019-09-21 MED ORDER — GABAPENTIN 300 MG PO CAPS
300.0000 mg | ORAL_CAPSULE | ORAL | Status: AC
  Administered 2019-09-21: 300 mg via ORAL
  Filled 2019-09-21: qty 1

## 2019-09-21 MED ORDER — PROPOFOL 10 MG/ML IV BOLUS
INTRAVENOUS | Status: DC | PRN
  Administered 2019-09-21: 150 mg via INTRAVENOUS

## 2019-09-21 MED ORDER — SODIUM CHLORIDE 0.9 % IR SOLN
Status: DC | PRN
  Administered 2019-09-21 (×3): 1000 mL

## 2019-09-21 MED ORDER — IBUPROFEN 200 MG PO TABS: 600.0000 mg | ORAL_TABLET | Freq: Four times a day (QID) | ORAL | 0 refills | Status: AC | PRN

## 2019-09-21 MED ORDER — MIDAZOLAM HCL 5 MG/5ML IJ SOLN
INTRAMUSCULAR | Status: DC | PRN
  Administered 2019-09-21: 2 mg via INTRAVENOUS

## 2019-09-21 MED ORDER — ACETAMINOPHEN 650 MG RE SUPP: 650.0000 mg | Freq: Four times a day (QID) | RECTAL | Status: DC

## 2019-09-21 MED ORDER — CHLORHEXIDINE GLUCONATE 0.12 % MT SOLN
15.0000 mL | Freq: Once | OROMUCOSAL | Status: AC
  Administered 2019-09-21: 15 mL via OROMUCOSAL
  Filled 2019-09-21: qty 15

## 2019-09-21 MED ORDER — FENTANYL CITRATE (PF) 250 MCG/5ML IJ SOLN
INTRAMUSCULAR | Status: AC
  Filled 2019-09-21: qty 5

## 2019-09-21 MED ORDER — AMISULPRIDE (ANTIEMETIC) 5 MG/2ML IV SOLN: 10.0000 mg | Freq: Once | INTRAVENOUS | Status: DC | PRN

## 2019-09-21 MED ORDER — ONDANSETRON HCL 4 MG/2ML IJ SOLN
INTRAMUSCULAR | Status: DC | PRN
  Administered 2019-09-21: 4 mg via INTRAVENOUS

## 2019-09-21 MED ORDER — PROPOFOL 10 MG/ML IV BOLUS
INTRAVENOUS | Status: AC
  Filled 2019-09-21: qty 20

## 2019-09-21 MED ORDER — SUGAMMADEX SODIUM 200 MG/2ML IV SOLN
INTRAVENOUS | Status: DC | PRN
  Administered 2019-09-21: 200 mg via INTRAVENOUS

## 2019-09-21 MED ORDER — ACETAMINOPHEN 325 MG PO TABS: 650.0000 mg | ORAL_TABLET | Freq: Four times a day (QID) | ORAL | Status: DC | PRN

## 2019-09-21 MED ORDER — LIDOCAINE 2% (20 MG/ML) 5 ML SYRINGE
INTRAMUSCULAR | Status: AC
  Filled 2019-09-21: qty 5

## 2019-09-21 MED ORDER — PHENYLEPHRINE 40 MCG/ML (10ML) SYRINGE FOR IV PUSH (FOR BLOOD PRESSURE SUPPORT)
PREFILLED_SYRINGE | INTRAVENOUS | Status: DC | PRN
  Administered 2019-09-21: 80 ug via INTRAVENOUS
  Administered 2019-09-21: 120 ug via INTRAVENOUS

## 2019-09-21 MED ORDER — FENTANYL CITRATE (PF) 100 MCG/2ML IJ SOLN: 25.0000 ug | INTRAMUSCULAR | Status: DC | PRN

## 2019-09-21 MED ORDER — OXYCODONE HCL 5 MG PO TABS: 5.0000 mg | ORAL_TABLET | Freq: Four times a day (QID) | ORAL | 0 refills | Status: DC | PRN

## 2019-09-21 MED ORDER — DEXAMETHASONE SODIUM PHOSPHATE 10 MG/ML IJ SOLN
INTRAMUSCULAR | Status: AC
  Filled 2019-09-21: qty 1

## 2019-09-21 MED ORDER — LIDOCAINE 2% (20 MG/ML) 5 ML SYRINGE
INTRAMUSCULAR | Status: DC | PRN
  Administered 2019-09-21: 60 mg via INTRAVENOUS

## 2019-09-21 MED ORDER — PHENYLEPHRINE 40 MCG/ML (10ML) SYRINGE FOR IV PUSH (FOR BLOOD PRESSURE SUPPORT)
PREFILLED_SYRINGE | INTRAVENOUS | Status: AC
  Filled 2019-09-21: qty 10

## 2019-09-21 MED ORDER — MIDAZOLAM HCL 2 MG/2ML IJ SOLN
INTRAMUSCULAR | Status: AC
  Filled 2019-09-21: qty 2

## 2019-09-21 SURGICAL SUPPLY — 50 items
APPLIER CLIP 5 13 M/L LIGAMAX5 (MISCELLANEOUS) ×4
BLADE CLIPPER SURG (BLADE) IMPLANT
CANISTER SUCT 3000ML PPV (MISCELLANEOUS) ×4 IMPLANT
CHLORAPREP W/TINT 26 (MISCELLANEOUS) ×4 IMPLANT
CLIP APPLIE 5 13 M/L LIGAMAX5 (MISCELLANEOUS) ×2 IMPLANT
COVER MAYO STAND STRL (DRAPES) IMPLANT
COVER SURGICAL LIGHT HANDLE (MISCELLANEOUS) ×4 IMPLANT
COVER WAND RF STERILE (DRAPES) IMPLANT
DERMABOND ADVANCED (GAUZE/BANDAGES/DRESSINGS) ×2
DERMABOND ADVANCED .7 DNX12 (GAUZE/BANDAGES/DRESSINGS) ×2 IMPLANT
DRAPE C-ARM 42X120 X-RAY (DRAPES) IMPLANT
ELECT REM PT RETURN 9FT ADLT (ELECTROSURGICAL) ×4
ELECTRODE REM PT RTRN 9FT ADLT (ELECTROSURGICAL) ×2 IMPLANT
FILTER SMOKE EVAC LAPAROSHD (FILTER) IMPLANT
GLOVE BIO SURGEON STRL SZ8 (GLOVE) ×4 IMPLANT
GLOVE BIOGEL PI IND STRL 6.5 (GLOVE) ×2 IMPLANT
GLOVE BIOGEL PI IND STRL 8 (GLOVE) ×2 IMPLANT
GLOVE BIOGEL PI INDICATOR 6.5 (GLOVE) ×2
GLOVE BIOGEL PI INDICATOR 8 (GLOVE) ×2
GLOVE ECLIPSE 6.0 STRL STRAW (GLOVE) ×4 IMPLANT
GLOVE ECLIPSE 6.5 STRL STRAW (GLOVE) ×4 IMPLANT
GLOVE INDICATOR 6.5 STRL GRN (GLOVE) ×4 IMPLANT
GOWN STRL REUS W/ TWL LRG LVL3 (GOWN DISPOSABLE) ×4 IMPLANT
GOWN STRL REUS W/ TWL XL LVL3 (GOWN DISPOSABLE) ×2 IMPLANT
GOWN STRL REUS W/TWL LRG LVL3 (GOWN DISPOSABLE) ×4
GOWN STRL REUS W/TWL XL LVL3 (GOWN DISPOSABLE) ×2
KIT BASIN OR (CUSTOM PROCEDURE TRAY) ×4 IMPLANT
KIT TURNOVER KIT B (KITS) ×4 IMPLANT
L-HOOK LAP DISP 36CM (ELECTROSURGICAL) ×4
LHOOK LAP DISP 36CM (ELECTROSURGICAL) ×2 IMPLANT
NEEDLE 22X1 1/2 (OR ONLY) (NEEDLE) ×4 IMPLANT
NS IRRIG 1000ML POUR BTL (IV SOLUTION) ×4 IMPLANT
PAD ARMBOARD 7.5X6 YLW CONV (MISCELLANEOUS) ×4 IMPLANT
PENCIL BUTTON HOLSTER BLD 10FT (ELECTRODE) ×4 IMPLANT
POUCH RETRIEVAL ECOSAC 10 (ENDOMECHANICALS) ×2 IMPLANT
POUCH RETRIEVAL ECOSAC 10MM (ENDOMECHANICALS) ×2
SCISSORS LAP 5X35 DISP (ENDOMECHANICALS) ×4 IMPLANT
SET CHOLANGIOGRAPH 5 50 .035 (SET/KITS/TRAYS/PACK) IMPLANT
SET IRRIG TUBING LAPAROSCOPIC (IRRIGATION / IRRIGATOR) ×4 IMPLANT
SET TUBE SMOKE EVAC HIGH FLOW (TUBING) ×4 IMPLANT
SLEEVE ENDOPATH XCEL 5M (ENDOMECHANICALS) ×8 IMPLANT
SPECIMEN JAR SMALL (MISCELLANEOUS) ×4 IMPLANT
SUT VIC AB 4-0 PS2 27 (SUTURE) ×4 IMPLANT
TOWEL GREEN STERILE (TOWEL DISPOSABLE) ×4 IMPLANT
TOWEL GREEN STERILE FF (TOWEL DISPOSABLE) ×4 IMPLANT
TRAY LAPAROSCOPIC MC (CUSTOM PROCEDURE TRAY) ×4 IMPLANT
TROCAR BLADELESS 11MM (ENDOMECHANICALS) ×4 IMPLANT
TROCAR XCEL BLUNT TIP 100MML (ENDOMECHANICALS) ×4 IMPLANT
TROCAR XCEL NON-BLD 5MMX100MML (ENDOMECHANICALS) ×4 IMPLANT
WATER STERILE IRR 1000ML POUR (IV SOLUTION) ×4 IMPLANT

## 2019-09-21 NOTE — Anesthesia Preprocedure Evaluation (Signed)
Anesthesia Evaluation  Patient identified by MRN, date of birth, ID band Patient awake    Reviewed: Allergy & Precautions, NPO status , Patient's Chart, lab work & pertinent test results  Airway Mallampati: II  TM Distance: >3 FB     Dental  (+) Dental Advisory Given   Pulmonary neg pulmonary ROS,    breath sounds clear to auscultation       Cardiovascular negative cardio ROS   Rhythm:Regular Rate:Normal     Neuro/Psych negative neurological ROS     GI/Hepatic negative GI ROS, Neg liver ROS,   Endo/Other  negative endocrine ROS  Renal/GU negative Renal ROS     Musculoskeletal   Abdominal   Peds  Hematology negative hematology ROS (+)   Anesthesia Other Findings   Reproductive/Obstetrics                             Anesthesia Physical Anesthesia Plan  ASA: II  Anesthesia Plan: General   Post-op Pain Management:    Induction: Intravenous  PONV Risk Score and Plan: 3 and Dexamethasone, Ondansetron and Treatment may vary due to age or medical condition  Airway Management Planned:   Additional Equipment:   Intra-op Plan:   Post-operative Plan: Extubation in OR  Informed Consent: I have reviewed the patients History and Physical, chart, labs and discussed the procedure including the risks, benefits and alternatives for the proposed anesthesia with the patient or authorized representative who has indicated his/her understanding and acceptance.     Dental advisory given  Plan Discussed with: CRNA  Anesthesia Plan Comments:         Anesthesia Quick Evaluation

## 2019-09-21 NOTE — Progress Notes (Signed)
Pt ambulated with NT up and down the hallway and did very well, she ate a chicken salad sandwich with no Nausea and she has voided 2x since returning from surgery. Pt states that her pain is doing well get 5 of oxy at 1621 and has felt fine since. Will discharge Pt shortly

## 2019-09-21 NOTE — Progress Notes (Signed)
Just did a post-op evaluation on Ms. Reitman  She is sore but her pain is overall better. She has had mild nausea but no vomiting. Is tolerating clear liquids. She has not been out of bed yet but plans to get up to urinate. She states she would like to go home this evening. Her mother is at the bedside.   Gen- somnolent but easily arouses to voice, NAD, cooperative pulm - normal effort ORA abd - soft, appropriately tender, incisions c/d/i without surrounding cellulitis or active drainage    Discussed post-operative instructions with patient regarding physical activity, returning to work, bathing. Answered all of her questions. Patient needs to void, ambulate in the hall, and eat some solid food. If she is able to get up, void,  Tolerate PO, and her pain is controlled then she can go home this evening with her mother.    Hosie Spangle, PA-C

## 2019-09-21 NOTE — Progress Notes (Signed)
Day of Surgery   Subjective/Chief Complaint: Less RUQ pain   Objective: Vital signs in last 24 hours: Temp:  [98 F (36.7 C)-98.4 F (36.9 C)] 98.1 F (36.7 C) (07/07 0526) Pulse Rate:  [65-83] 65 (07/07 0526) Resp:  [16-17] 16 (07/07 0526) BP: (95-112)/(59-73) 95/62 (07/07 0526) SpO2:  [95 %-99 %] 99 % (07/07 0526) Weight:  [74.8 kg] 74.8 kg (07/07 1029)    Intake/Output from previous day: 07/06 0701 - 07/07 0700 In: 522.5 [P.O.:240; I.V.:282.5] Out: -  Intake/Output this shift: No intake/output data recorded.  General appearance: alert and cooperative Resp: clear to auscultation bilaterally Cardio: regular rate and rhythm GI: soft NT  Lab Results:  Recent Labs    09/20/19 0720 09/21/19 0507  WBC 9.9 8.7  HGB 11.8* 10.3*  HCT 37.7 32.2*  PLT 307 262   BMET Recent Labs    09/20/19 0720 09/21/19 0507  NA 137 139  K 3.9 4.1  CL 106 107  CO2 23 26  GLUCOSE 101* 99  BUN 8 <5*  CREATININE 0.77 0.70  CALCIUM 9.0 8.6*   PT/INR No results for input(s): LABPROT, INR in the last 72 hours. ABG No results for input(s): PHART, HCO3 in the last 72 hours.  Invalid input(s): PCO2, PO2  Studies/Results: US Abdomen Limited RUQ  Result Date: 09/20/2019 CLINICAL DATA:  Abdominal pain EXAM: ULTRASOUND ABDOMEN LIMITED RIGHT UPPER QUADRANT COMPARISON:  August 26, 2018 FINDINGS: Gallbladder: Gallbladder is somewhat distended. There is sludge throughout the gallbladder. There are intermingled gallstones, largest measuring 1.6 cm in length. There is no appreciable gallbladder wall thickening or pericholecystic fluid. No sonographic Murphy sign noted by sonographer. Common bile duct: Diameter: 5 mm. No intrahepatic or extrahepatic biliary duct dilatation. Liver: No focal lesion identified. Within normal limits in parenchymal echogenicity. Portal vein is patent on color Doppler imaging with normal direction of blood flow towards the liver. Other: None. IMPRESSION: Gallbladder  distended with sludge and cholelithiasis. No appreciable gallbladder wall thickening or pericholecystic fluid. This appearance of the gallbladder raises concern for potential degree of underlying cholecystitis. This finding may warrant nuclear medicine hepatobiliary imaging study to assess for cystic duct patency. Study otherwise unremarkable. Electronically Signed   By: Bretta Bang III M.D.   On: 09/20/2019 09:58    Anti-infectives: Anti-infectives (From admission, onward)   Start     Dose/Rate Route Frequency Ordered Stop   09/21/19 0600  [MAR Hold]  ceFAZolin (ANCEF) IVPB 2g/100 mL premix     Discontinue     (MAR Hold since Wed 09/21/2019 at 1023.Hold Reason: Transfer to a Procedural area.)   2 g 200 mL/hr over 30 Minutes Intravenous On call to O.R. 09/20/19 1113 09/22/19 0559      Assessment/Plan:  Symptomatic cholelithiasis, possible early acute calculous cholecystitis  - afebrile, WBC WNL, LFTs WNL - for laparoscopic cholecystectomy, cholangiogram. Procedure, risks, and benefits again discussed. She agrees.  LOS: 0 days    Sheri Gilbert 09/21/2019

## 2019-09-21 NOTE — Transfer of Care (Signed)
Immediate Anesthesia Transfer of Care Note  Patient: Sheri Gilbert  Procedure(s) Performed: LAPAROSCOPIC CHOLECYSTECTOMY (N/A Abdomen)  Patient Location: PACU  Anesthesia Type:General  Level of Consciousness: drowsy and patient cooperative  Airway & Oxygen Therapy: Patient Spontanous Breathing and Patient connected to nasal cannula oxygen  Post-op Assessment: Report given to RN and Post -op Vital signs reviewed and stable  Post vital signs: Reviewed and stable  Last Vitals:  Vitals Value Taken Time  BP 133/81 09/21/19 1333  Temp    Pulse 70 09/21/19 1334  Resp 18 09/21/19 1334  SpO2 100 % 09/21/19 1334  Vitals shown include unvalidated device data.  Last Pain:  Vitals:   09/21/19 1048  TempSrc:   PainSc: 0-No pain         Complications: No complications documented.

## 2019-09-21 NOTE — Anesthesia Procedure Notes (Signed)
Procedure Name: Intubation Date/Time: 09/21/2019 12:01 PM Performed by: Moshe Salisbury, CRNA Pre-anesthesia Checklist: Patient identified, Emergency Drugs available, Suction available and Patient being monitored Patient Re-evaluated:Patient Re-evaluated prior to induction Oxygen Delivery Method: Circle System Utilized Preoxygenation: Pre-oxygenation with 100% oxygen Induction Type: IV induction Ventilation: Mask ventilation without difficulty Laryngoscope Size: Mac and 3 Grade View: Grade I Tube type: Oral Tube size: 7.5 mm Number of attempts: 1 Airway Equipment and Method: Stylet and Oral airway Placement Confirmation: ETT inserted through vocal cords under direct vision,  positive ETCO2 and breath sounds checked- equal and bilateral Secured at: 20 cm Tube secured with: Tape Dental Injury: Teeth and Oropharynx as per pre-operative assessment

## 2019-09-21 NOTE — Op Note (Signed)
09/20/2019 - 09/21/2019  1:07 PM  PATIENT:  Sheri Gilbert  29 y.o. female  PRE-OPERATIVE DIAGNOSIS:  cholecystitis  POST-OPERATIVE DIAGNOSIS:  cholecystitis  PROCEDURE:  Procedure(s): LAPAROSCOPIC CHOLECYSTECTOMY  SURGEON:  Surgeon(s): Violeta Gelinas, MD  ASSISTANTS: Bailey Mech, PA-C   ANESTHESIA:   local and general  EBL:  No intake/output data recorded.  BLOOD ADMINISTERED:none  DRAINS: none   SPECIMEN:  Excision  DISPOSITION OF SPECIMEN:  PATHOLOGY  COUNTS:  YES  DICTATION: .Dragon Dictation Findings: Acute on chronic cholecystitis with very thick bile and multiple stones  Procedure in detail: Informed consent was obtained.  She received intravenous antibiotics.  She was brought to the operating room and general endotracheal anesthesia was administered by the anesthesia staff.  Her abdomen was prepped and draped in sterile fashion.  We did a timeout procedure.The infraumbilical region was infiltrated with local. Infraumbilical incision was made. Subcutaneous tissues were dissected down revealing the anterior fascia. This was divided sharply along the midline. Peritoneal cavity was entered under direct vision without complication. A 0 Vicryl pursestring was placed around the fascial opening. Hassan trocar was inserted into the abdomen. The abdomen was insufflated with carbon dioxide in standard fashion. Under direct vision a 5 mm epigastric and 5 mm right abdominal port x2 were placed.  Local was used at each port site.  Laparoscopic exploration revealed a very distended gallbladder with multiple adhesions from the omentum.  These adhesions were gradually taken down.  It was difficult to grab the gallbladder due to the distention so I attempted to drain it with the Nahzat suction but the bowel was extremely thick and hard to aspirate.  We were able to get a better grasp, however, in the dome of the gallbladder was retracted superior medially.  I swept down further adhesions from  the body and infundibulum.  Dissection began laterally and progressed medially first identifying the cystic duct.  This was dissected and a critical view of safety was achieved.  3 clips were placed proximally and one was placed distally and it was divided.  Further dissection revealed the cystic artery which was clipped twice proximally and once distally.  It was divided.  There was a smaller anterior branch of the cystic artery which was also clipped on the downside.  The gallbladder was taken off the liver bed using cautery.  It was placed in a bag and removed from the abdomen.  It was sent to pathology.  The liver bed was cauterized for excellent hemostasis.  Due to the thick bile and spillage of 1 stone from the gallbladder, I upsized the epigastric port to 11 mm.  This allowed me to use the stone grasper to remove the stone and then use a large bore suction to thoroughly irrigate out the abdomen.  We used multiple liters of saline until things were clear.  The liver bed was dry.  Clips remain in good position.  Ports were then removed under direct vision.  Pneumoperitoneum was released.  Infraumbilical fascia was closed by tying the pursestring.  All 4 wounds were irrigated and the skin of each was closed with running 4-0 Vicryl followed by Dermabond.  All counts were correct.  She tolerated procedure without apparent complication and was taken recovery in stable condition.  PATIENT DISPOSITION:  PACU - hemodynamically stable.   Delay start of Pharmacological VTE agent (>24hrs) due to surgical blood loss or risk of bleeding:  no  Violeta Gelinas, MD, MPH, FACS Pager: [phone]  7/7/20211:07 PM

## 2019-09-21 NOTE — Progress Notes (Signed)
PT was given discharge instructions and she stated understanding, 1 new medication escribed to pt home pharmacy, belongings packed and pt got herself dressed. Family at bedside for discharge

## 2019-09-21 NOTE — Discharge Instructions (Signed)
[facility] LAPAROSCOPIC SURGERY: POST OP INSTRUCTIONS Always review your discharge instruction sheet given to you by the facility where your surgery was performed. IF YOU HAVE DISABILITY OR FAMILY LEAVE FORMS, YOU MUST BRING THEM TO THE OFFICE FOR PROCESSING.   DO NOT GIVE THEM TO YOUR DOCTOR.  PAIN CONTROL  1. First take acetaminophen (Tylenol) AND/or ibuprofen (Advil) to control your pain after surgery.  Follow directions on package.  Taking acetaminophen (Tylenol) and/or ibuprofen (Advil) regularly after surgery will help to control your pain and lower the amount of prescription pain medication you may need.  You should not take more than 3,000 mg (3 grams) of acetaminophen (Tylenol) in 24 hours.  You should not take ibuprofen (Advil), aleve, motrin, naprosyn or other NSAIDS if you have a history of stomach ulcers or chronic kidney disease.  2. A prescription for pain medication may be given to you upon discharge.  Take your pain medication as prescribed, if you still have uncontrolled pain after taking acetaminophen (Tylenol) or ibuprofen (Advil). 3. Use ice packs to help control pain. 4. If you need a refill on your pain medication, please contact your pharmacy.  They will contact our office to request authorization. Prescriptions will not be filled after 5pm or on week-ends.  HOME MEDICATIONS 5. Take your usually prescribed medications unless otherwise directed.  DIET 6. You should follow a light diet the first few days after arrival home.  Be sure to include lots of fluids daily. Avoid fatty, fried foods.   CONSTIPATION 7. It is common to experience some constipation after surgery and if you are taking pain medication.  Increasing fluid intake and taking a stool softener (such as Colace) will usually help or prevent this problem from occurring.  A mild laxative (Milk of Magnesia or Miralax) should be taken according to package instructions if there are no bowel  movements after 48 hours.  WOUND/INCISION CARE 8. Most patients will experience some swelling and bruising in the area of the incisions.  Ice packs will help.  Swelling and bruising can take several days to resolve.  9. Unless discharge instructions indicate otherwise, follow guidelines below  a. STERI-STRIPS - you may remove your outer bandages 48 hours after surgery, and you may shower at that time.  You have steri-strips (small skin tapes) in place directly over the incision.  These strips should be left on the skin for 7-10 days.   b. DERMABOND/SKIN GLUE - you may shower in 24 hours.  The glue will flake off over the next 2-3 weeks. 10. Any sutures or staples will be removed at the office during your follow-up visit.  ACTIVITIES 11. You may resume regular (light) daily activities beginning the next day--such as daily self-care, walking, climbing stairs--gradually increasing activities as tolerated.  You may have sexual intercourse when it is comfortable.  Refrain from any heavy lifting or straining until approved by your doctor. a. You may drive when you are no longer taking prescription pain medication, you can comfortably wear a seatbelt, and you can safely maneuver your car and apply brakes.  FOLLOW-UP 12. You should see your doctor in the office for a follow-up appointment approximately 2-3 weeks after your surgery.  You should have been given your post-op/follow-up appointment when your surgery was scheduled.  If you did not receive a post-op/follow-up appointment, make sure that you call for this appointment within a day or two after you arrive home to insure a convenient appointment time.  WHEN   TO CALL YOUR DOCTOR: 1. Fever over 101.0 2. Inability to urinate 3. Continued bleeding from incision. 4. Increased pain, redness, or drainage from the incision. 5. Increasing abdominal pain  The clinic staff is available to answer your questions during regular business hours.  Please don't  hesitate to call and ask to speak to one of the nurses for clinical concerns.  If you have a medical emergency, go to the nearest emergency room or call 911.  A surgeon from [facility] is always on call at the hospital. [address], [address], [place], [place]  [address] ? [address] [address], [place], [place]   [address] [phone] ? [phone] ? FAX [phone] Web site: [email or web address]  

## 2019-09-22 ENCOUNTER — Encounter (HOSPITAL_COMMUNITY): Payer: Self-pay | Admitting: General Surgery

## 2019-09-22 LAB — SURGICAL PATHOLOGY

## 2019-09-22 NOTE — Anesthesia Postprocedure Evaluation (Signed)
Anesthesia Post Note  Patient: Kiesha Ensey  Procedure(s) Performed: LAPAROSCOPIC CHOLECYSTECTOMY (N/A Abdomen)     Patient location during evaluation: PACU Anesthesia Type: General Level of consciousness: awake and alert Pain management: pain level controlled Vital Signs Assessment: post-procedure vital signs reviewed and stable Respiratory status: spontaneous breathing, nonlabored ventilation, respiratory function stable and patient connected to nasal cannula oxygen Cardiovascular status: blood pressure returned to baseline and stable Postop Assessment: no apparent nausea or vomiting Anesthetic complications: no   No complications documented.  Last Vitals:  Vitals:   09/21/19 1450 09/21/19 1517  BP: 106/70 116/73  Pulse: 63 95  Resp: 16 20  Temp: (!) 36.1 C 36.6 C  SpO2: 99% 95%    Last Pain:  Vitals:   09/21/19 1517  TempSrc: Oral  PainSc: 4                  Kennieth Rad

## 2019-09-29 NOTE — Discharge Summary (Signed)
[facility] Discharge Summary   Patient ID: Sheri Gilbert MRN: [id number] DOB/AGE: 1990/09/14 29 y.o.  Admit date: 09/20/2019 Discharge date: 09/21/2019  Discharge Diagnosis Patient Active Problem List   Diagnosis Date Noted  . Cholecystitis with cholelithiasis 09/20/2019    Consultants N/A Imaging: RUQ U/S Gallbladder is somewhat distended. There is sludge throughout the gallbladder. There are intermingled gallstones, largest measuring 1.6 cm in length. There is no appreciable gallbladder wall thickening or pericholecystic fluid. No sonographic Murphy sign noted by sonographer.  Procedures Dr. Violeta Gelinas (09/21/19) - Laparoscopic Cholecystectomy  HPI:  Sheri Gilbert is a 29 y/o F who presented to the ED today with a cc acute onset RUQ abdominal pain. Patient reports eating baked pork chops for dinner, going to bed, and then woke up at 0200 with severe, 10/10, non-radiating RUQ pain associated with nausea and vomiting. She reports a history of similar, but less severe, pain in the past. Reports episodes of pain started about 7 years ago. Initially they were only 1-2 times per year but have progressed to episodes of RUQ pain 1-2 times weekly. Patient reports a history of low iron for which she used to takes supplements for but has not taken them in a while. Denies smoking, alcohol, or illicit drug use. She lives at home with her 47 and 18 year-old daughters. Works from home. States she has had 2 c-sections, one of which was complicated by bleeding requiring transfusion. No other history of abdominal surgeries. NKDA. Denies possibility of being pregnant currently - has a nexplanon.   Hospital Course:    Workup revealed acute cholecystitis.  Patient was admitted and underwent procedure listed above.  Tolerated procedure well and was transferred to the floor.  Diet was advanced as tolerated.  On POD#0, the patient was voiding well, tolerating diet, ambulating well, pain well  controlled, vital signs stable, incisions c/d/i and felt stable for discharge home.  Patient will follow up in our office in 2 weeks and knows to call with questions or concerns.  She will call to confirm appointment date/time.    Physical Exam: General:  Alert, NAD, pleasant, comfortable Abd:  Soft, ND, mild tenderness, incisions C/D/I  Allergies as of 09/21/2019   No Known Allergies     Medication List    STOP taking these medications   doxycycline 100 MG capsule Commonly known as: VIBRAMYCIN   esomeprazole 40 MG capsule Commonly known as: NEXIUM   naproxen 500 MG tablet Commonly known as: NAPROSYN     TAKE these medications   acetaminophen 325 MG tablet Commonly known as: TYLENOL Take 2-3 tablets (650-975 mg total) by mouth every 6 (six) hours as needed for mild pain (or temp > 100).   Nexplanon 68 MG Impl implant Generic drug: etonogestrel 1 each by Subdermal route once.   omeprazole 20 MG capsule Commonly known as: PRILOSEC Take 20 mg by mouth in the morning and at bedtime.   oxyCODONE 5 MG immediate release tablet Commonly known as: Oxy IR/ROXICODONE Take 1 tablet (5 mg total) by mouth every 6 (six) hours as needed for moderate pain or severe pain (no releived by tylenol or ibuprofen).   sucralfate 1 g tablet Commonly known as: CARAFATE Take 1 g by mouth 4 (four) times daily.     ASK your doctor about these medications   ibuprofen 200 MG tablet Commonly known as: ADVIL Take 3 tablets (600 mg total) by mouth every 6 (six) hours as needed for up to 5 days for  mild pain. Ask about: Should I take this medication?         Follow-up Information    [facility] Follow up on 10/06/2019.   Specialty: General Surgery Why: at 2:00 PM for post-operative follow up. Please arrive 30 minutes early. Contact information: [address] [place] [address] [phone]              Signed: Hosie Spangle,  [facility] 09/29/2019, 1:19 PM

## 2019-12-02 ENCOUNTER — Encounter (HOSPITAL_COMMUNITY): Payer: Self-pay

## 2019-12-02 ENCOUNTER — Emergency Department (HOSPITAL_COMMUNITY)
Admission: EM | Admit: 2019-12-02 | Discharge: 2019-12-02 | Disposition: A | Discharge status: Home / Self Care | Payer: 59 | Attending: Emergency Medicine | Admitting: Emergency Medicine

## 2019-12-02 ENCOUNTER — Other Ambulatory Visit: Payer: Self-pay

## 2019-12-02 DIAGNOSIS — R590 Localized enlarged lymph nodes: Secondary | ICD-10-CM | POA: Insufficient documentation

## 2019-12-02 DIAGNOSIS — Z79899 Other long term (current) drug therapy: Secondary | ICD-10-CM | POA: Insufficient documentation

## 2019-12-02 DIAGNOSIS — J020 Streptococcal pharyngitis: Secondary | ICD-10-CM | POA: Insufficient documentation

## 2019-12-02 LAB — GROUP A STREP BY PCR: Group A Strep by PCR: DETECTED — AB

## 2019-12-02 MED ORDER — DEXAMETHASONE 4 MG PO TABS
6.0000 mg | ORAL_TABLET | Freq: Once | ORAL | Status: AC
  Administered 2019-12-02: 6 mg via ORAL
  Filled 2019-12-02: qty 1

## 2019-12-02 MED ORDER — KETOROLAC TROMETHAMINE 30 MG/ML IJ SOLN
30.0000 mg | Freq: Once | INTRAMUSCULAR | Status: AC
  Administered 2019-12-02: 30 mg via INTRAMUSCULAR
  Filled 2019-12-02: qty 1

## 2019-12-02 MED ORDER — PENICILLIN G BENZATHINE 1200000 UNIT/2ML IM SUSP
1.2000 10*6.[IU] | Freq: Once | INTRAMUSCULAR | Status: AC
  Administered 2019-12-02: 1.2 10*6.[IU] via INTRAMUSCULAR
  Filled 2019-12-02: qty 2

## 2019-12-02 MED ORDER — PENICILLIN G BENZATHINE & PROC 1200000 UNIT/2ML IM SUSP: 1.2000 10*6.[IU] | Freq: Once | INTRAMUSCULAR | Status: DC

## 2019-12-02 NOTE — ED Provider Notes (Signed)
[facility]-EMERGENCY DEPT Provider Note   CSN: [id number] Arrival date & time: 12/02/19  1719     History Chief Complaint  Patient presents with  . Sore Throat    Sheri Gilbert is a 29 y.o. female presenting to the emergency department with 2 days of sore throat.  Patient states symptoms began yesterday, however upon awakening this morning her sore throat was more painful.  Pain is worse with swallowing and talking.  She has associated headache.  Denies fever, cough, rhinorrhea, congestion, or other URI symptoms.  No abdominal complaints.  She has not treated her symptoms with any medications.  No known Covid contacts.  The history is provided by the patient.       History reviewed. No pertinent past medical history.  Patient Active Problem List   Diagnosis Date Noted  . Cholecystitis with cholelithiasis 09/20/2019    Past Surgical History:  Procedure Laterality Date  . CESAREAN SECTION    . CHOLECYSTECTOMY N/A 09/21/2019   Procedure: LAPAROSCOPIC CHOLECYSTECTOMY;  Surgeon: Violeta Gelinas, MD;  Location: [facility] OR;  Service: General;  Laterality: N/A;     OB History   No obstetric history on file.     History reviewed. No pertinent family history.  Social History   Tobacco Use  . Smoking status: Never Smoker  . Smokeless tobacco: Never Used  Vaping Use  . Vaping Use: Never used  Substance Use Topics  . Alcohol use: Not Currently  . Drug use: Not Currently    Home Medications Prior to Admission medications   Medication Sig Start Date End Date Taking? Authorizing Provider  acetaminophen (TYLENOL) 325 MG tablet Take 2-3 tablets (650-975 mg total) by mouth every 6 (six) hours as needed for mild pain (or temp > 100). 09/21/19   Simaan, Francine Graven, PA-C  etonogestrel (NEXPLANON) 68 MG IMPL implant 1 each by Subdermal route once.    [provider]  omeprazole (PRILOSEC) 20 MG capsule Take 20 mg by mouth in the morning and at bedtime.     [provider]  oxyCODONE (OXY IR/ROXICODONE) 5 MG immediate release tablet Take 1 tablet (5 mg total) by mouth every 6 (six) hours as needed for moderate pain or severe pain (no releived by tylenol or ibuprofen). 09/21/19   Adam Phenix, PA-C  sucralfate (CARAFATE) 1 g tablet Take 1 g by mouth 4 (four) times daily.    [provider]    Allergies    Patient has no known allergies.  Review of Systems   Review of Systems  HENT: Positive for sore throat.   Neurological: Positive for headaches.  All other systems reviewed and are negative.   Physical Exam Updated Vital Signs BP 110/77 (BP Location: Right Arm)   Pulse 77   Temp 98.5 F (36.9 C) (Oral)   Resp 16   Ht 5\' 2"  (1.575 m)   Wt 74.8 kg   SpO2 100%   BMI 30.18 kg/m   Physical Exam Vitals and nursing note reviewed.  Constitutional:      General: She is not in acute distress.    Appearance: She is well-developed. She is not ill-appearing.  HENT:     Head: Normocephalic and atraumatic.     Mouth/Throat:     Tonsils: Tonsillar exudate present.     Comments: Uvula is midline, no trismus.  Tolerating secretions.  Bilateral tonsillar erythema, edema and exudates.   Eyes:     Conjunctiva/sclera: Conjunctivae normal.  Cardiovascular:  Rate and Rhythm: Normal rate and regular rhythm.  Pulmonary:     Effort: Pulmonary effort is normal. No respiratory distress.     Breath sounds: Normal breath sounds.  Abdominal:     Palpations: Abdomen is soft.  Musculoskeletal:     Cervical back: Normal range of motion and neck supple.  Lymphadenopathy:     Cervical: Cervical adenopathy present.  Skin:    General: Skin is warm.  Neurological:     Mental Status: She is alert.  Psychiatric:        Behavior: Behavior normal.     ED Results / Procedures / Treatments   Labs (all labs ordered are listed, but only abnormal results are displayed) Labs Reviewed  GROUP A STREP BY PCR - Abnormal; Notable for  the following components:      Result Value   Group A Strep by PCR DETECTED (*)    All other components within normal limits    EKG None  Radiology No results found.  Procedures Procedures (including critical care time)  Medications Ordered in ED Medications  ketorolac (TORADOL) 30 MG/ML injection 30 mg (has no administration in time range)  dexamethasone (DECADRON) tablet 6 mg (has no administration in time range)  penicillin g benzathine (BICILLIN LA) 1200000 UNIT/2ML injection 1.2 Million Units (has no administration in time range)    ED Course  I have reviewed the triage vital signs and the nursing notes.  Pertinent labs & imaging results that were available during my care of the patient were reviewed by me and considered in my medical decision making (see chart for details).    MDM Rules/Calculators/A&P                          Pt presenting with sore throat that began yesterday. Assoc dysphagia and headache. On exam, pt is tolerating secretions, uvula midline without trismus, tonsillar edema and erythema with exudates, anterior cervical adenopathy present. Positive strep by PCR. Treated in the ED with steroids, NSAIDs, and PCN IM.  Discussed importance of water rehydration. Presentation not concerning for PTA or infxn spread to soft tissue. Specific return precautions discussed. Pt able to drink water in ED without difficulty with intact air way. Recommended PCP follow up.   Discussed results, findings, treatment and follow up. Patient advised of return precautions. Patient verbalized understanding and agreed with plan.  Final Clinical Impression(s) / ED Diagnoses Final diagnoses:  Strep pharyngitis    Rx / DC Orders ED Discharge Orders    None       Savilla Turbyfill, [place] N, PA-C 12/02/19 2036    Mancel Bale, MD 12/04/19 2023

## 2019-12-02 NOTE — ED Triage Notes (Signed)
Patient c/o sore throat since yesterday. Worse today. Patient states "It feels like my throat is closing." patient speaks in complete sentences and sats are 100% on room air.

## 2019-12-02 NOTE — Discharge Instructions (Signed)
Please read instructions below.  You have been treated with penicillin today for strep throat. You can take tylenol or ibuprofen  as needed for sore throat or fever.  Drink plenty of water.  Use saline nasal spray for congestion. Follow up with your primary care provider.  Return to the ER for inability to swallow liquids, difficulty breathing, or new or worsening symptoms.  

## 2020-03-17 HISTORY — PX: ABDOMINOPLASTY: SHX5355

## 2020-03-20 ENCOUNTER — Other Ambulatory Visit: Payer: Medicaid Other

## 2020-06-17 ENCOUNTER — Emergency Department
Admission: EM | Admit: 2020-06-17 | Discharge: 2020-06-17 | Disposition: A | Discharge status: Home / Self Care | Payer: 59 | Attending: Emergency Medicine | Admitting: Emergency Medicine

## 2020-06-17 ENCOUNTER — Other Ambulatory Visit: Payer: Self-pay

## 2020-06-17 DIAGNOSIS — Z5321 Procedure and treatment not carried out due to patient leaving prior to being seen by health care provider: Secondary | ICD-10-CM | POA: Diagnosis not present

## 2020-06-17 DIAGNOSIS — T8189XA Other complications of procedures, not elsewhere classified, initial encounter: Secondary | ICD-10-CM | POA: Insufficient documentation

## 2020-06-17 DIAGNOSIS — X58XXXA Exposure to other specified factors, initial encounter: Secondary | ICD-10-CM | POA: Diagnosis not present

## 2020-06-17 LAB — COMPREHENSIVE METABOLIC PANEL
ALT: 99 U/L — ABNORMAL HIGH (ref 0–44)
AST: 33 U/L (ref 15–41)
Albumin: 3.4 g/dL — ABNORMAL LOW (ref 3.5–5.0)
Alkaline Phosphatase: 136 U/L — ABNORMAL HIGH (ref 38–126)
Anion gap: 8 (ref 5–15)
BUN: 10 mg/dL (ref 6–20)
CO2: 22 mmol/L (ref 22–32)
Calcium: 9.3 mg/dL (ref 8.9–10.3)
Chloride: 107 mmol/L (ref 98–111)
Creatinine, Ser: 0.63 mg/dL (ref 0.44–1.00)
GFR, Estimated: >60 mL/min (ref >60)
Glucose, Bld: 96 mg/dL (ref 70–99)
Potassium: 4.2 mmol/L (ref 3.5–5.1)
Sodium: 137 mmol/L (ref 135–145)
Total Bilirubin: 0.6 mg/dL (ref 0.3–1.2)
Total Protein: 7.3 g/dL (ref 6.5–8.1)

## 2020-06-17 LAB — CBC WITH DIFFERENTIAL/PLATELET
Abs Immature Granulocytes: 0.14 10*3/uL — ABNORMAL HIGH (ref 0.00–0.07)
Basophils Absolute: 0 10*3/uL (ref 0.0–0.1)
Basophils Relative: 0 %
Eosinophils Absolute: 0.3 10*3/uL (ref 0.0–0.5)
Eosinophils Relative: 2 %
HCT: 28.5 % — ABNORMAL LOW (ref 36.0–46.0)
Hemoglobin: 9.2 g/dL — ABNORMAL LOW (ref 12.0–15.0)
Immature Granulocytes: 1 %
Lymphocytes Relative: 15 %
Lymphs Abs: 1.8 10*3/uL (ref 0.7–4.0)
MCH: 28.3 pg (ref 26.0–34.0)
MCHC: 32.3 g/dL (ref 30.0–36.0)
MCV: 87.7 fL (ref 80.0–100.0)
Monocytes Absolute: 0.8 10*3/uL (ref 0.1–1.0)
Monocytes Relative: 6 %
Neutro Abs: 9.4 10*3/uL — ABNORMAL HIGH (ref 1.7–7.7)
Neutrophils Relative %: 76 %
Platelets: 415 10*3/uL — ABNORMAL HIGH (ref 150–400)
RBC: 3.25 MIL/uL — ABNORMAL LOW (ref 3.87–5.11)
RDW: 13.2 % (ref 11.5–15.5)
WBC: 12.4 10*3/uL — ABNORMAL HIGH (ref 4.0–10.5)
nRBC: 0 % (ref 0.0–0.2)

## 2020-06-17 LAB — LACTIC ACID, PLASMA: Lactic Acid, Venous: 1.8 mmol/L (ref 0.5–1.9)

## 2020-06-17 NOTE — ED Notes (Signed)
Pt noted to not be in waiting room.

## 2020-06-17 NOTE — ED Triage Notes (Signed)
Pt via POV from home. Pt had a tummy tuck on 3/24 and had drain placed. Pt states that she is having drainage from the drainage insertion site. Pt states the drainage is light green and foul-odor. Pt states the site is also tender. Pt is A&Ox4 and NAD.

## 2020-09-01 IMAGING — CR DG ABDOMEN ACUTE W/ 1V CHEST
3 series · 3 of 3 positions shown · non-contrast
Comparison: None.

CLINICAL DATA: 28-year-old female with nausea and abdominal pain
for several hours.

EXAM:
DG ABDOMEN ACUTE W/ 1V CHEST

[w chest pa]
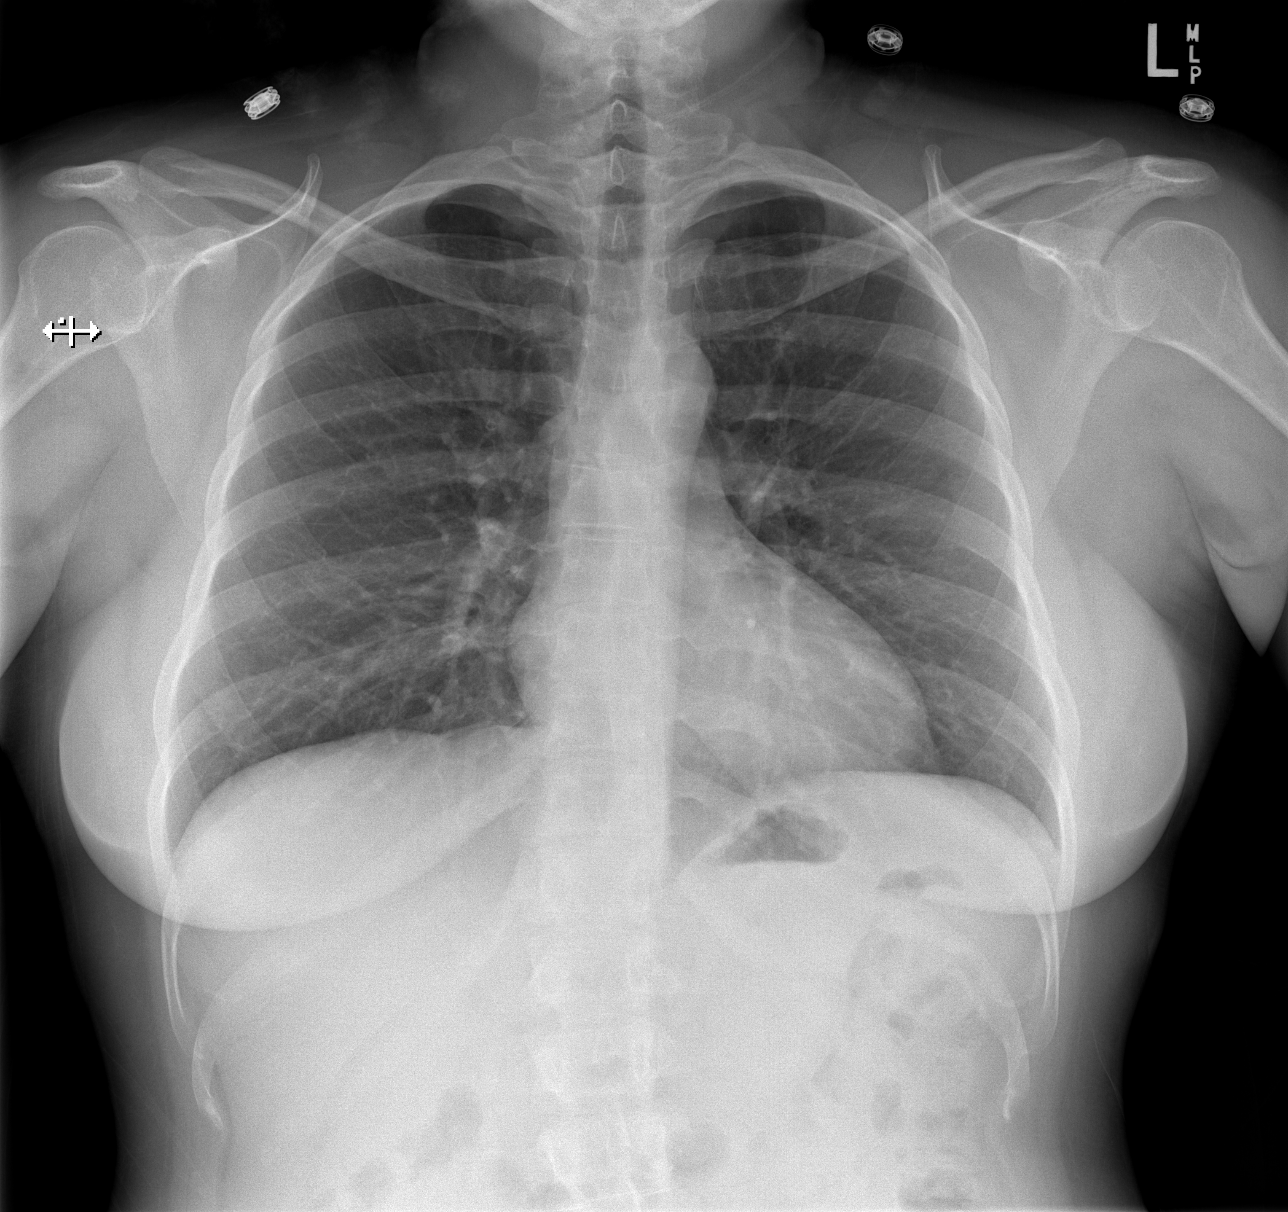

[w abdomen upright]
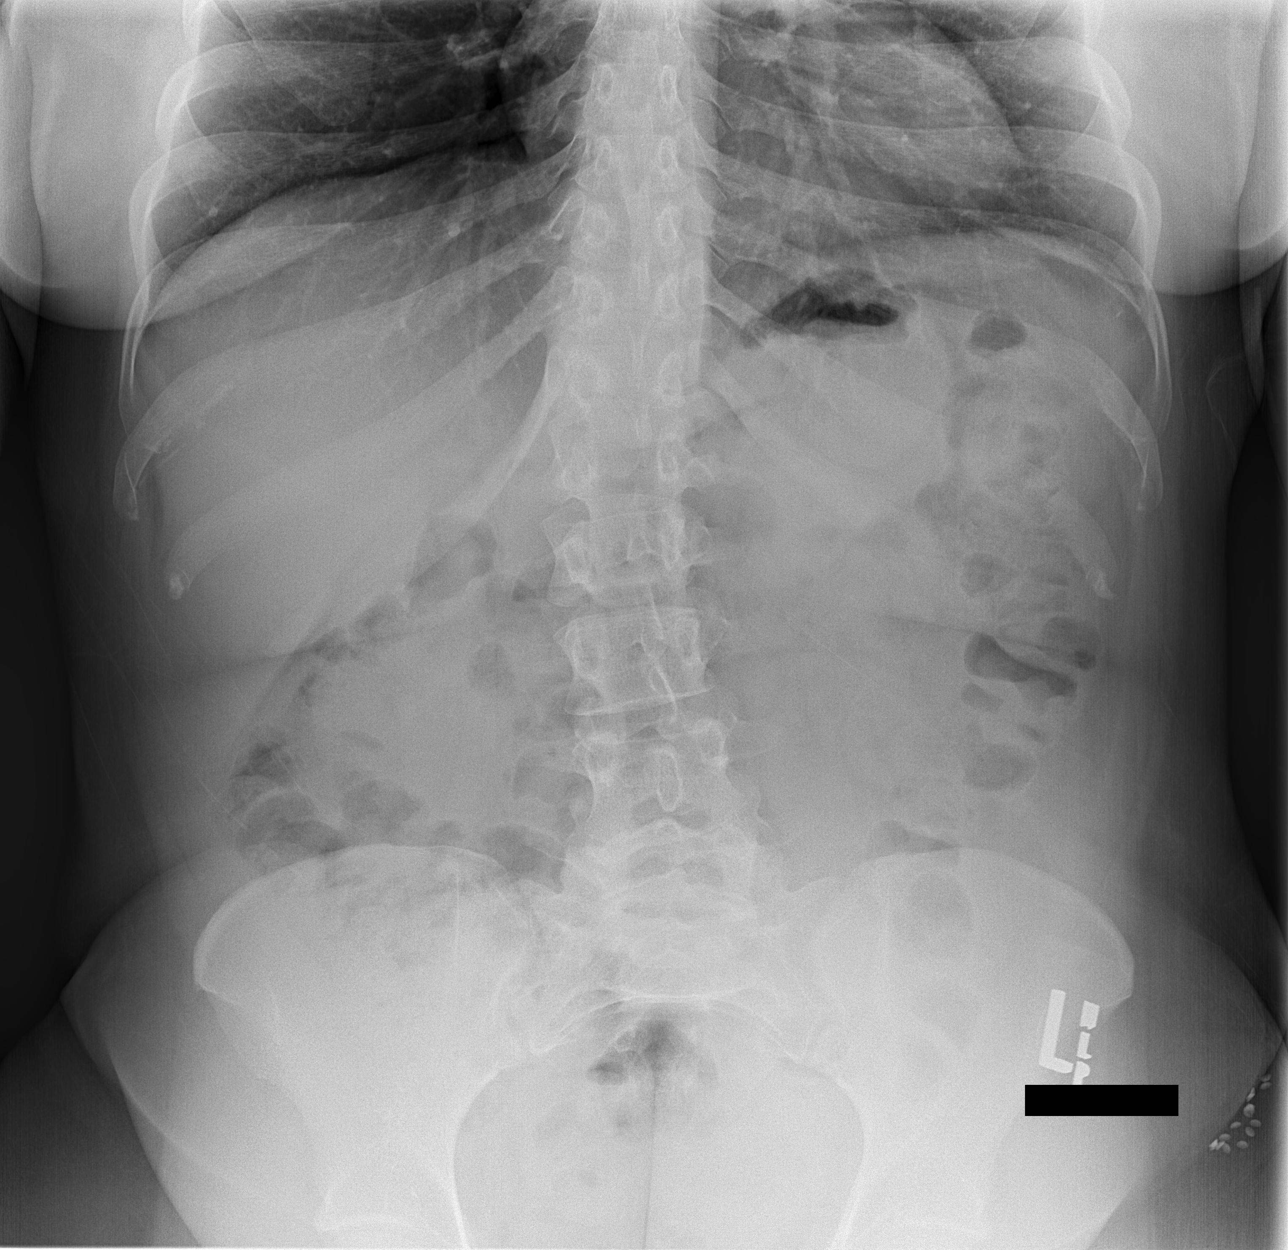

[t abdomen supine]
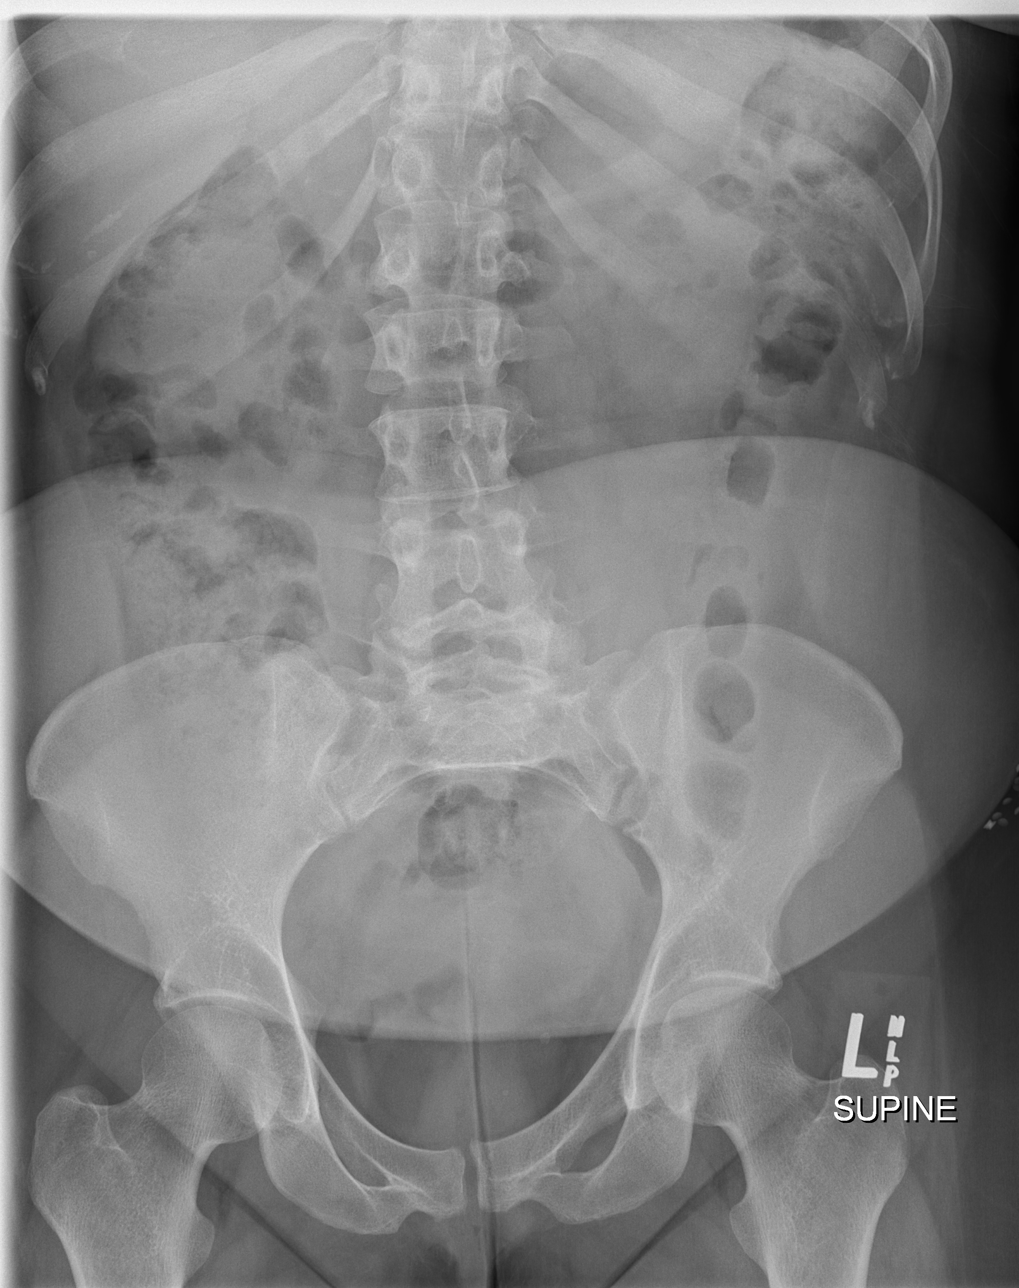

[3 of 3 positions shown; findings below may reference images not displayed]

FINDINGS: PA view of the chest. Normal cardiac size and mediastinal contours.
Visualized tracheal air column is within normal limits. Both lungs
appear clear. No pneumothorax or pleural effusion.

Upright and supine views of the abdomen. Non obstructed bowel gas
pattern. Abdominal and pelvic visceral contours are within normal
limits.

Mild dextroconvex thoracolumbar scoliosis. No acute osseous
abnormality identified.
IMPRESSION: 1. Normal bowel gas pattern, no free air.
2. No cardiopulmonary abnormality.

## 2021-01-31 IMAGING — US US ABDOMEN LIMITED
1 series · 14 of 25 positions shown · non-contrast
Comparison: None.

CLINICAL DATA: Upper abdominal pain

EXAM:
ULTRASOUND ABDOMEN LIMITED RIGHT UPPER QUADRANT

[Series 1: us abdomen limited · 0.23mm/px · 14 of 71 slices shown]
[im 1/71]
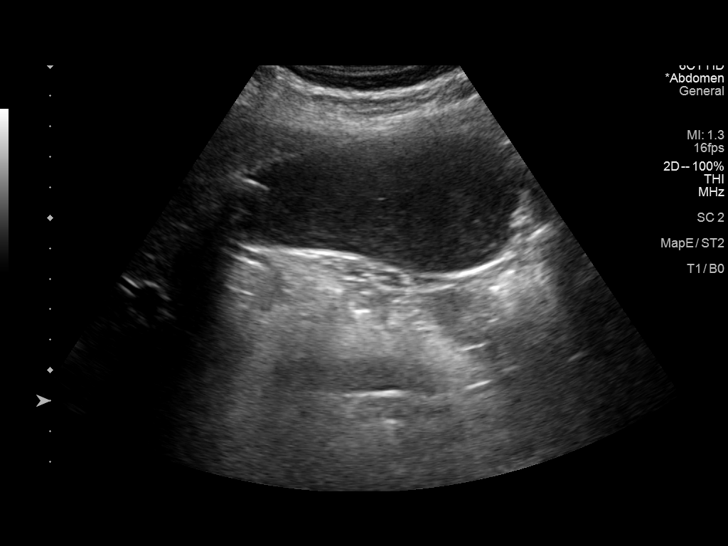
[im 6/71]
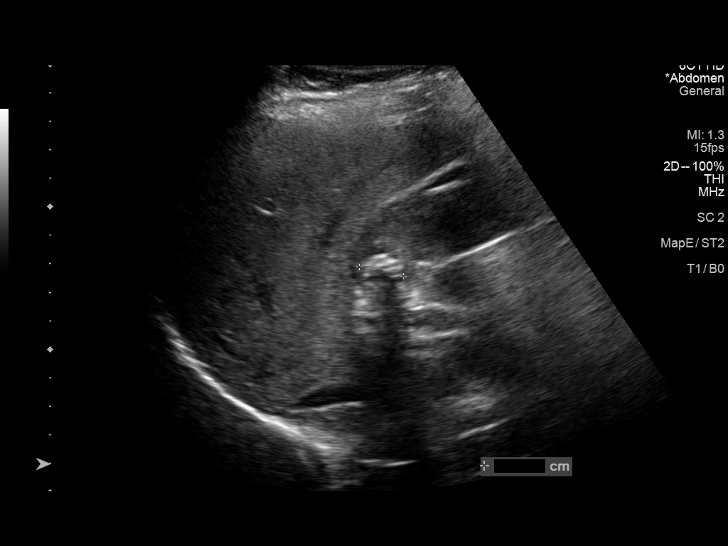
[im 12/71]
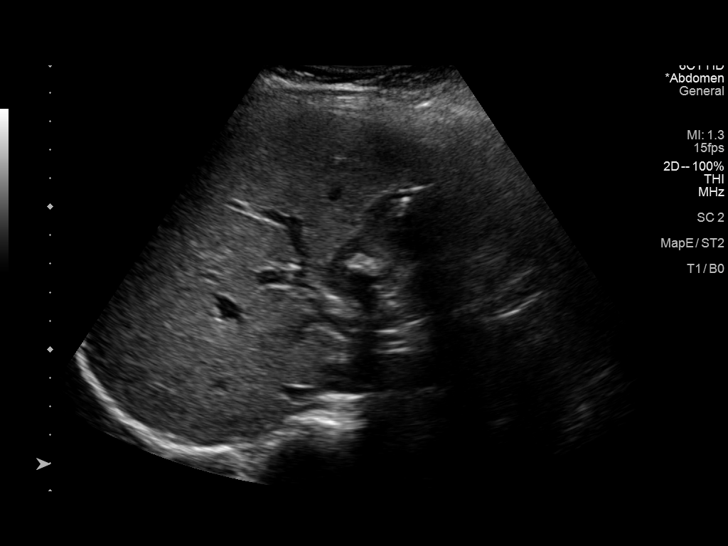
[im 18/71]
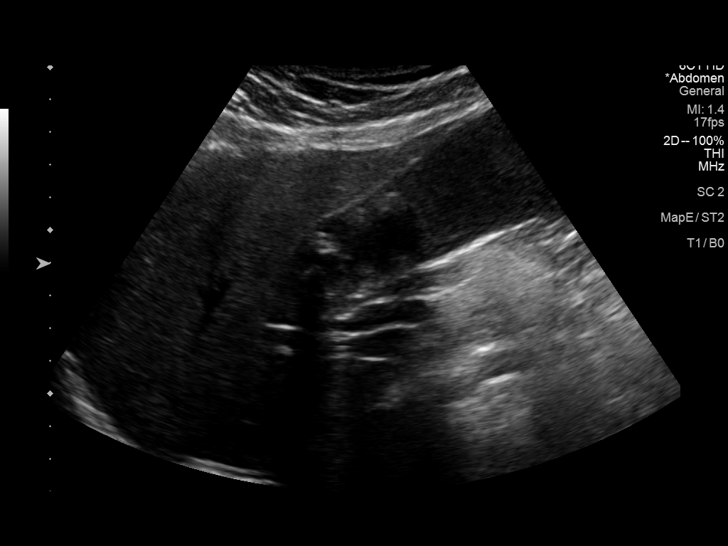
[im 24/71]
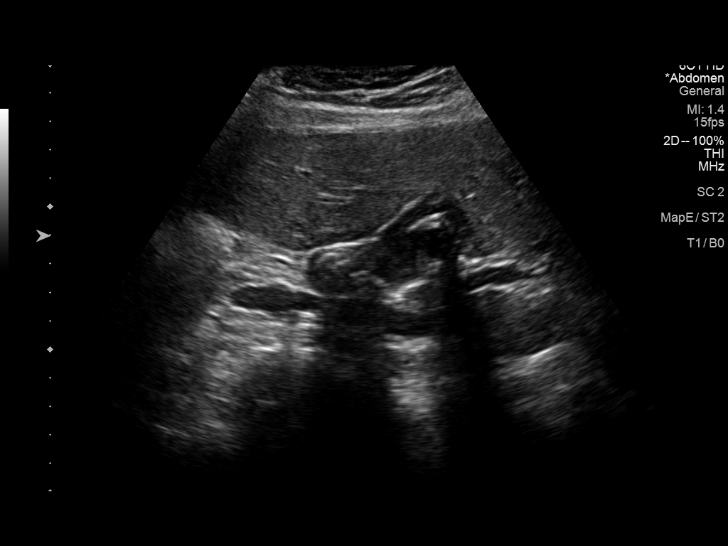
[im 27/71]
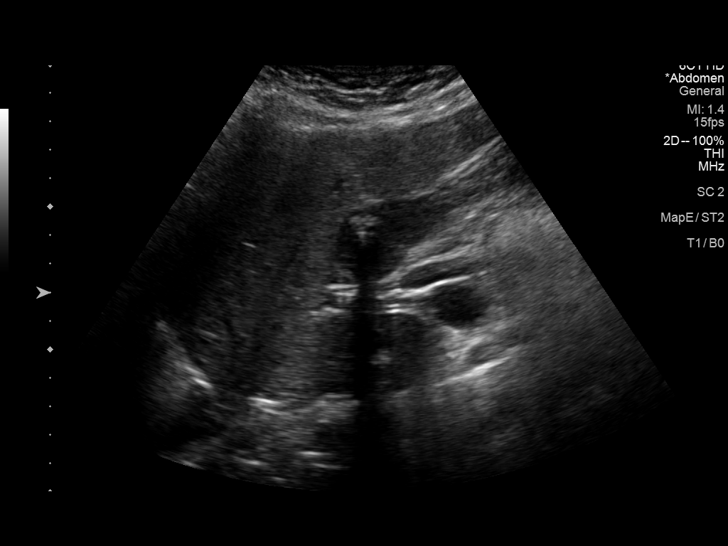
[im 33/71]
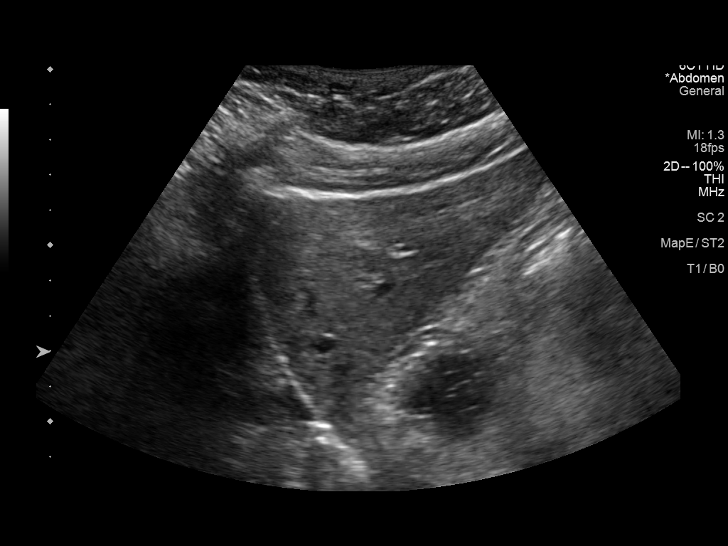
[im 38/71]
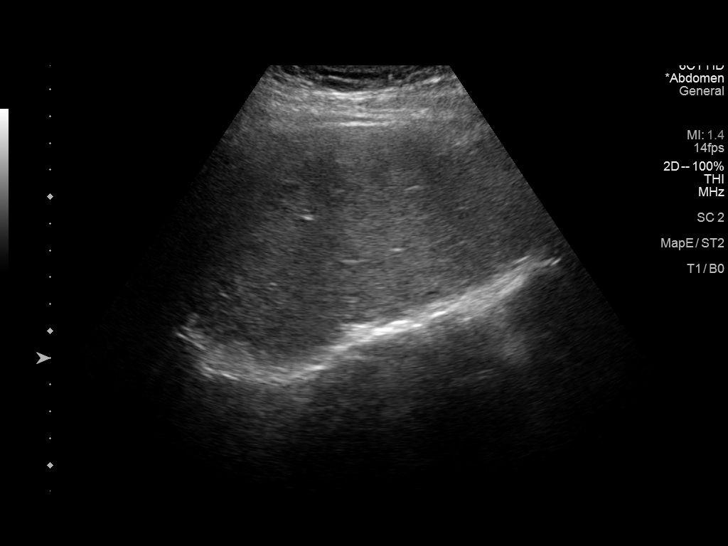
[im 44/71]
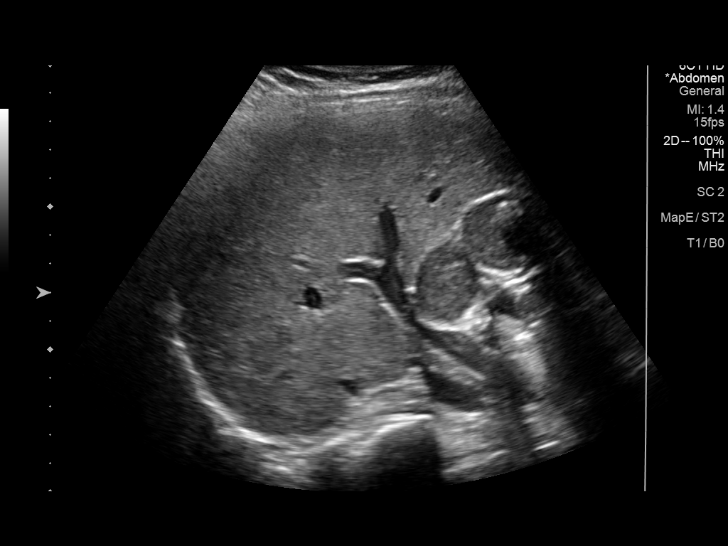
[im 47/71]
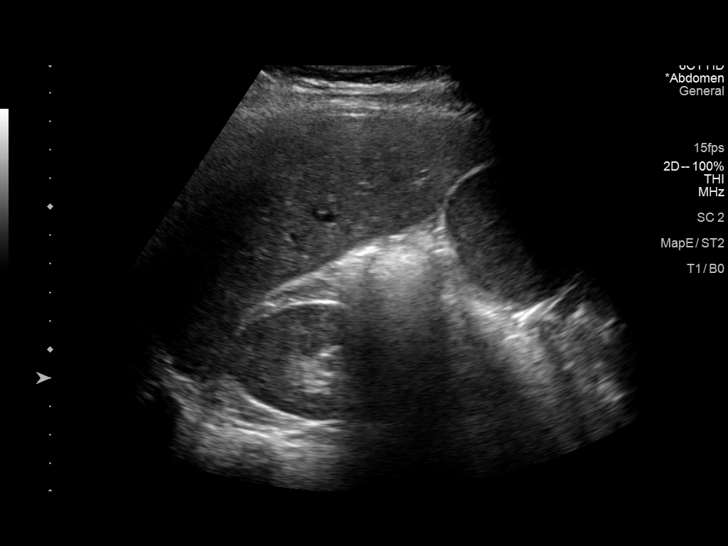
[im 53/71]
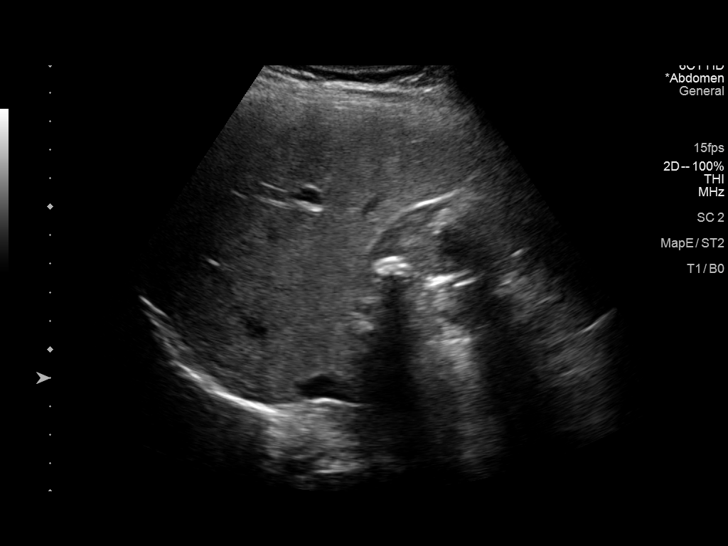
[im 59/71]
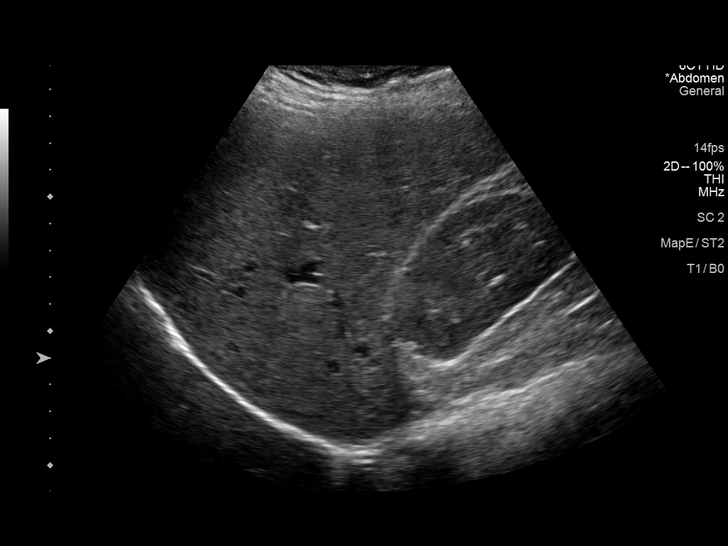
[im 65/71]
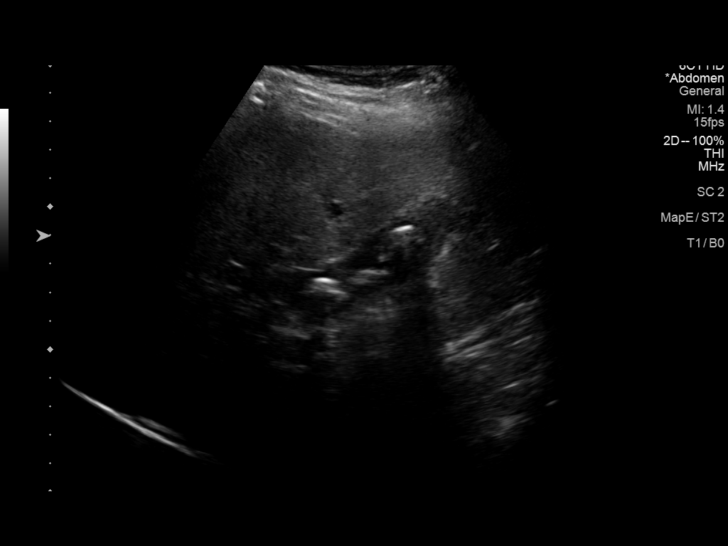
[im 71/71]
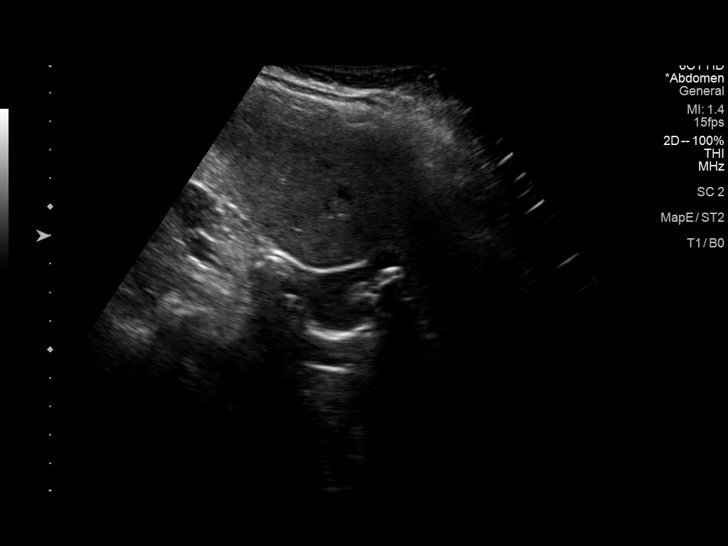

[14 of 25 positions shown; findings below may reference images not displayed]

FINDINGS: Gallbladder:

1.6 cm gallstone in the neck of the gallbladder. Other fundal
gallstones noted. Sludge seen within the gallbladder. No wall
thickening.

Common bile duct:

Diameter: Normal caliber, 5-6 mm

Liver:

No focal lesion identified. Within normal limits in parenchymal
echogenicity. Portal vein is patent on color Doppler imaging with
normal direction of blood flow towards the liver.

Other: None.
IMPRESSION: Cholelithiasis including 1.6 cm gallstone in the region of the
gallbladder neck, non mobile. Sludge within the gallbladder. No
sonographic changes of acute cholecystitis.

## 2021-02-25 IMAGING — US US ABDOMEN LIMITED
1 series · 14 of 25 positions shown · non-contrast
Comparison: August 26, 2018

CLINICAL DATA: Abdominal pain

EXAM:
ULTRASOUND ABDOMEN LIMITED RIGHT UPPER QUADRANT

[Series 1: us abdomen limited ruq · 14 of 49 slices shown]
[im 1/49]
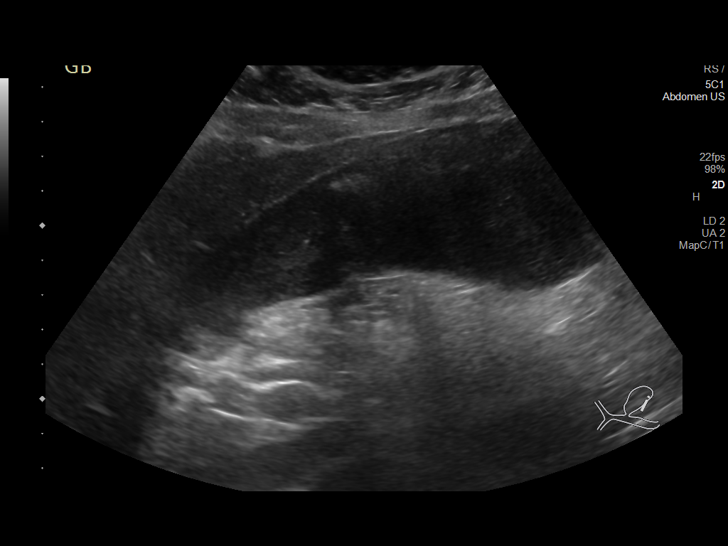
[im 5/49]
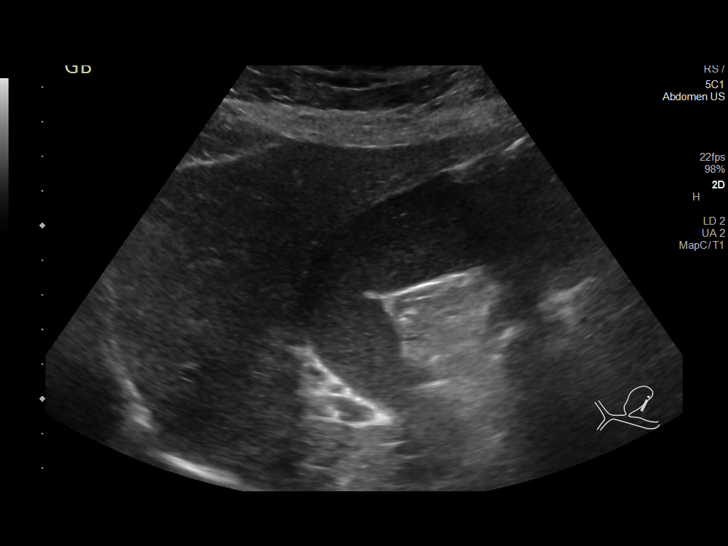
[im 9/49]
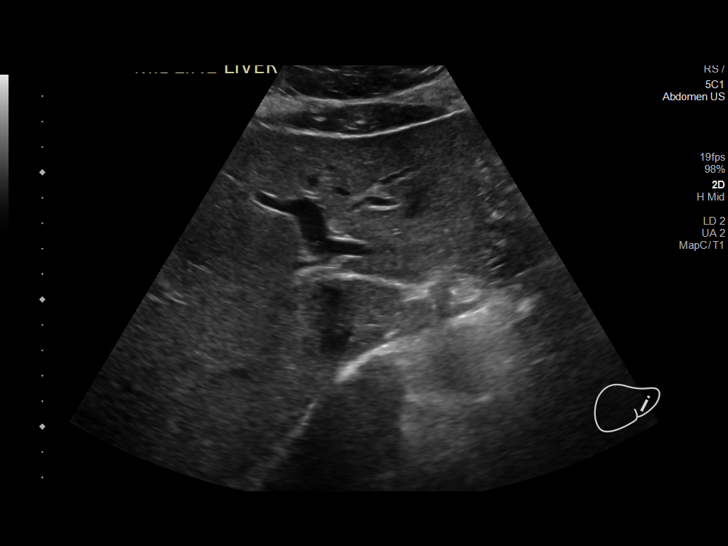
[im 13/49]
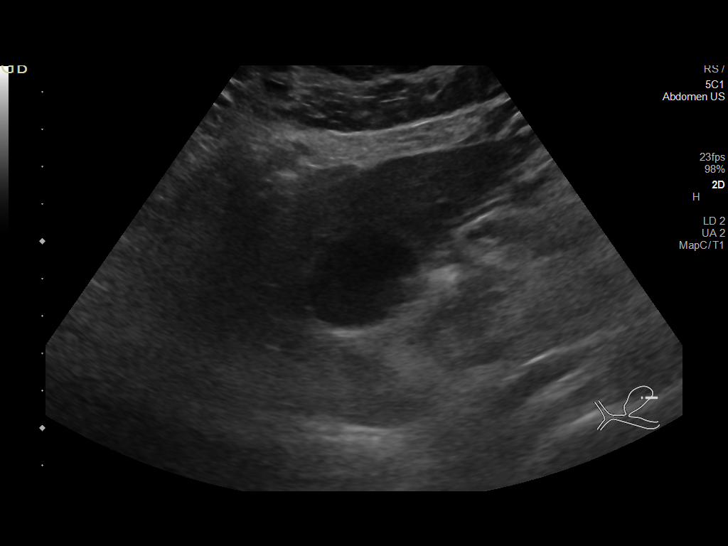
[im 17/49]
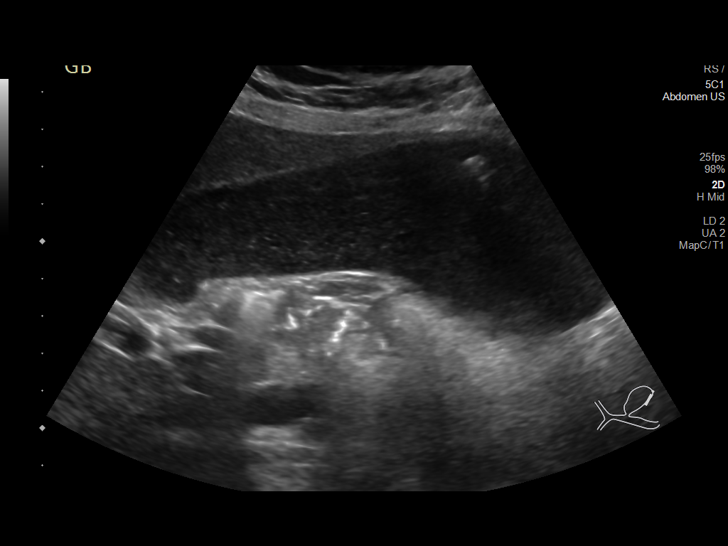
[im 19/49]
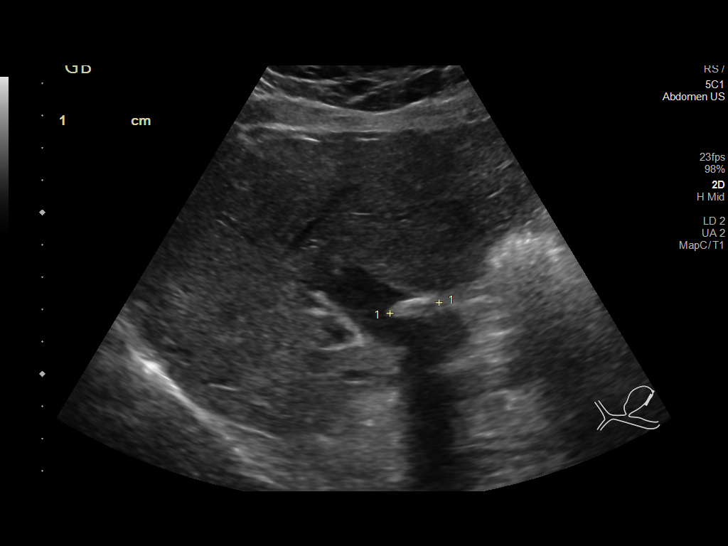
[im 23/49]
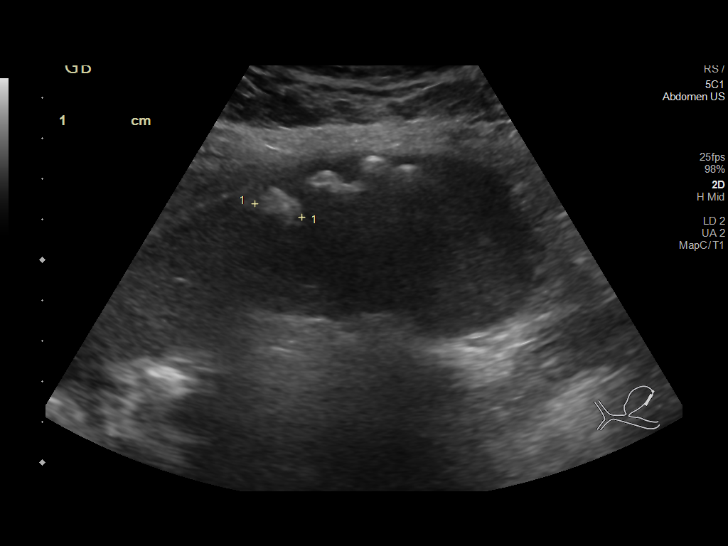
[im 27/49]
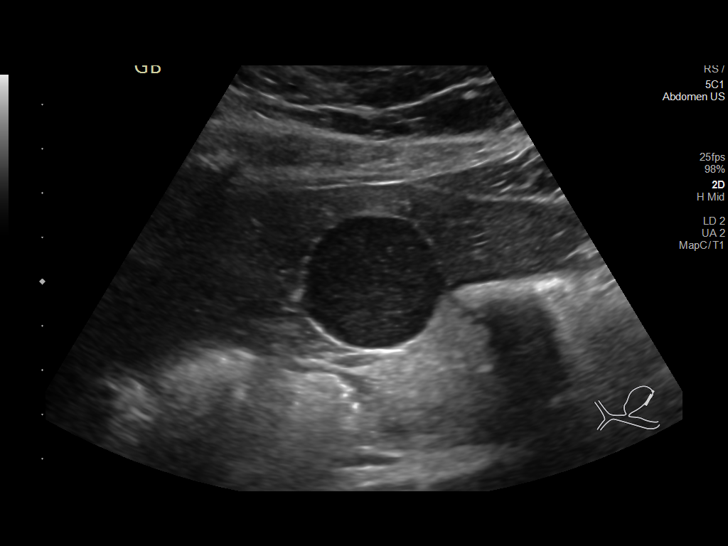
[im 31/49]
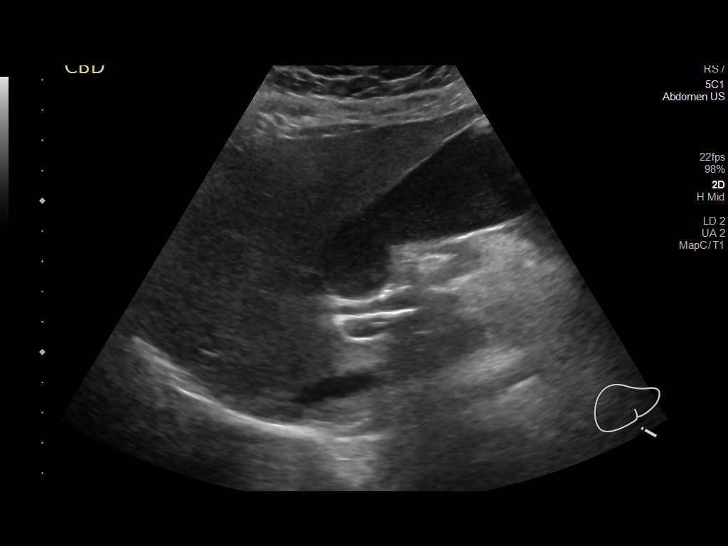
[im 33/49]
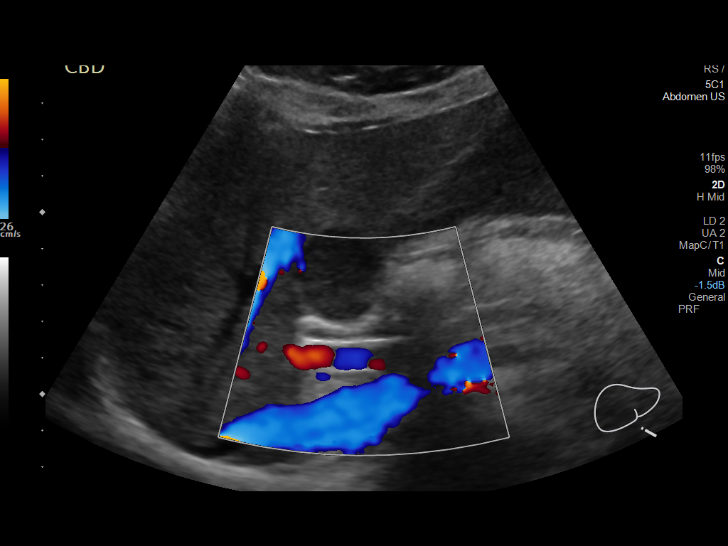
[im 37/49]
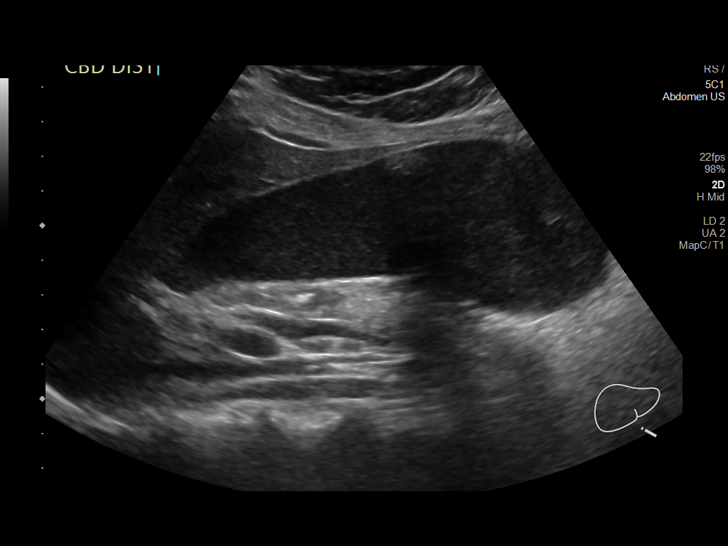
[im 41/49]
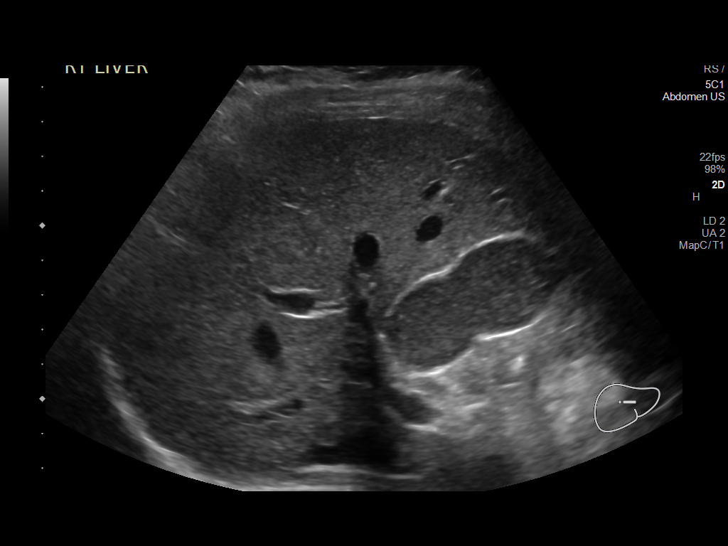
[im 45/49]
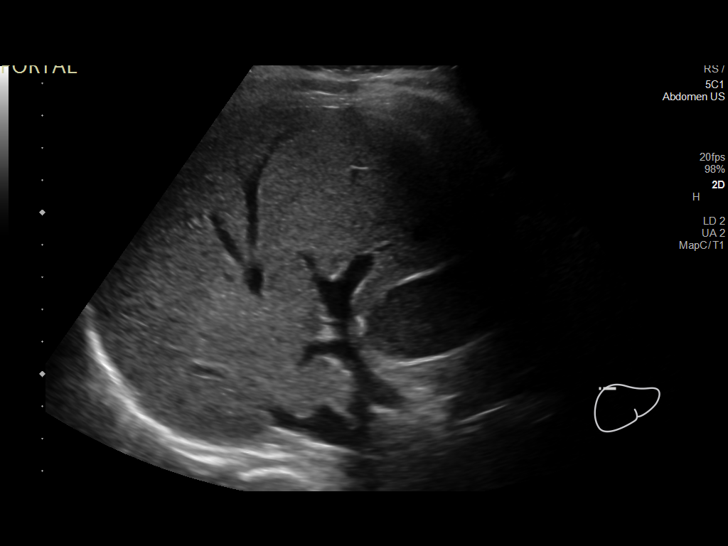
[im 49/49]
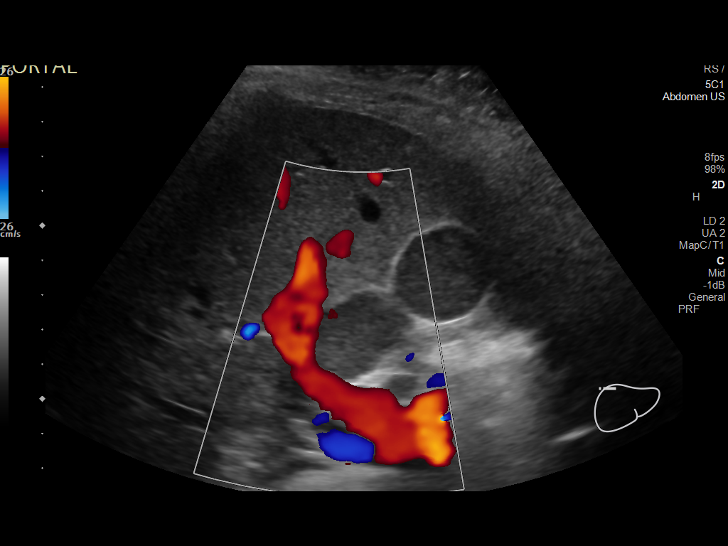

[14 of 25 positions shown; findings below may reference images not displayed]

FINDINGS: Gallbladder:

Gallbladder is somewhat distended. There is sludge throughout the
gallbladder. There are intermingled gallstones, largest measuring
1.6 cm in length. There is no appreciable gallbladder wall
thickening or pericholecystic fluid. No sonographic Murphy sign
noted by sonographer.

Common bile duct:

Diameter: 5 mm. No intrahepatic or extrahepatic biliary duct
dilatation.

Liver:

No focal lesion identified. Within normal limits in parenchymal
echogenicity. Portal vein is patent on color Doppler imaging with
normal direction of blood flow towards the liver.

Other: None.
IMPRESSION: Gallbladder distended with sludge and cholelithiasis. No appreciable
gallbladder wall thickening or pericholecystic fluid. This
appearance of the gallbladder raises concern for potential degree of
underlying cholecystitis. This finding may warrant nuclear medicine
hepatobiliary imaging study to assess for cystic duct patency.

Study otherwise unremarkable.

## 2021-09-13 ENCOUNTER — Encounter: Payer: Medicaid Other | Admitting: Obstetrics

## 2021-09-18 ENCOUNTER — Encounter: Payer: Self-pay | Admitting: Obstetrics and Gynecology

## 2021-09-18 ENCOUNTER — Ambulatory Visit (INDEPENDENT_AMBULATORY_CARE_PROVIDER_SITE_OTHER): Payer: Medicaid Other | Admitting: Obstetrics and Gynecology

## 2021-09-18 VITALS — BP 113/78 | HR 80 | Ht 62.0 in | Wt 172.3 lb

## 2021-09-18 DIAGNOSIS — Z3046 Encounter for surveillance of implantable subdermal contraceptive: Secondary | ICD-10-CM

## 2021-09-18 DIAGNOSIS — Z30017 Encounter for initial prescription of implantable subdermal contraceptive: Secondary | ICD-10-CM

## 2021-09-18 DIAGNOSIS — Z3202 Encounter for pregnancy test, result negative: Secondary | ICD-10-CM | POA: Diagnosis not present

## 2021-09-18 LAB — POCT URINE PREGNANCY: Preg Test, Ur: NEGATIVE

## 2021-09-18 MED ORDER — ETONOGESTREL 68 MG ~~LOC~~ IMPL
68.0000 mg | DRUG_IMPLANT | Freq: Once | SUBCUTANEOUS | Status: AC
  Administered 2021-09-18: 68 mg via SUBCUTANEOUS

## 2021-09-18 NOTE — Progress Notes (Signed)
New GYN is in the office for nexplanon removal and re-insertion. Pt reports [place]. Reports Last pap on 07/15/2021 at [facility]

## 2021-09-18 NOTE — Progress Notes (Signed)
     GYNECOLOGY OFFICE PROCEDURE NOTE  Sheri Gilbert is a 31 y.o. [id number] here for Nexplanon removal and Nexplanon reinsertion.  Last pap smear was on 07/15/21 and was normal.  No other gynecologic concerns.   Nexplanon Removal and Reinsertion Patient identified, informed consent performed, consent signed.   Patient does understand that irregular bleeding is a very common side effect of this medication. She was advised to have backup contraception for one week after replacement of the implant. Pregnancy test in clinic today was negative.  Appropriate time out taken. Nexplanon site identified in left arm.  Area prepped in usual sterile fashon. One ml of 1% lidocaine was used to anesthetize the area at the distal end of the implant. A small stab incision was made right beside the implant on the distal portion. The Nexplanon rod was grasped using hemostats and removed without difficulty. There was minimal blood loss. There were no complications.  Incision closed with 2 sutures of 3-0 vicryl.   Area was then injected with 3 ml of 1 % lidocaine. She was re-prepped with betadine, Nexplanon removed from packaging, Device confirmed in needle, then inserted full length of needle and withdrawn per handbook instructions. Nexplanon was able to palpated in the patient's arm; patient palpated the insert herself.  There was minimal blood loss. Small amount of bruising was noted.  Patient insertion site covered with gauze and a pressure bandage to reduce any bruising. The patient tolerated the procedure well and was given post procedure instructions.  She was advised to have backup contraception for one week.    Suture removal in 1 week.  Mariel Aloe, MD, FACOG Obstetrician & Gynecologist, [facility] for [facility], [facility]

## 2021-09-18 NOTE — Addendum Note (Signed)
Addended by: Natale Milch D on: 09/18/2021 02:46 PM   Modules accepted: Orders

## 2021-09-25 ENCOUNTER — Ambulatory Visit (INDEPENDENT_AMBULATORY_CARE_PROVIDER_SITE_OTHER): Payer: Self-pay | Admitting: Obstetrics and Gynecology

## 2021-09-25 VITALS — BP 112/75 | HR 81 | Ht 62.0 in | Wt 179.0 lb

## 2021-09-25 DIAGNOSIS — Z4802 Encounter for removal of sutures: Secondary | ICD-10-CM

## 2021-09-25 NOTE — Progress Notes (Signed)
Nursing removed 2 sutures without incident  Donavan Foil, MD

## 2021-09-25 NOTE — Progress Notes (Signed)
Suture removal nexplanon site. Healing well, no signs of infection. Site visualized by Dr. Donavan Foil. Sutures removed. Pt tolerated well.

## 2021-11-06 DIAGNOSIS — E079 Disorder of thyroid, unspecified: Secondary | ICD-10-CM

## 2021-11-06 DIAGNOSIS — E785 Hyperlipidemia, unspecified: Secondary | ICD-10-CM | POA: Insufficient documentation

## 2021-11-06 HISTORY — DX: Disorder of thyroid, unspecified: E07.9

## 2021-11-13 ENCOUNTER — Encounter: Payer: Self-pay | Admitting: Student

## 2021-11-13 ENCOUNTER — Ambulatory Visit (INDEPENDENT_AMBULATORY_CARE_PROVIDER_SITE_OTHER): Payer: Self-pay | Admitting: Student

## 2021-11-13 VITALS — BP 112/75 | HR 75 | Ht 62.0 in | Wt 174.2 lb

## 2021-11-13 DIAGNOSIS — N921 Excessive and frequent menstruation with irregular cycle: Secondary | ICD-10-CM

## 2021-11-13 DIAGNOSIS — Z975 Presence of (intrauterine) contraceptive device: Secondary | ICD-10-CM

## 2021-11-13 MED ORDER — CELECOXIB 200 MG PO CAPS: 200.0000 mg | ORAL_CAPSULE | Freq: Every day | ORAL | 0 refills | Status: AC

## 2021-11-13 MED ORDER — NORGESTIMATE-ETH ESTRADIOL 0.25-35 MG-MCG PO TABS: 1.0000 | ORAL_TABLET | Freq: Every day | ORAL | 1 refills | Status: DC

## 2021-11-13 NOTE — Progress Notes (Signed)
History:  [name] is a 31 y.o. [phone] who presents to clinic today for bleeding while on Nexplanon. Nexplanon inserted 09/18/21. Pt states she has been bleeding for 2 and a half weeks. Period is painful and requiring Q1hr pad changes. Pt wants excessive bleeding to stop.  The following portions of the patient's history were reviewed and updated as appropriate: allergies, current medications, family history, past medical history, social history, past surgical history and problem list.  Review of Systems:  Review of Systems  Constitutional:  Negative for chills and fever.       +fatigue  Respiratory:  Negative for cough and shortness of breath.   Cardiovascular:  Negative for chest pain.  Gastrointestinal:  Negative for abdominal pain, nausea and vomiting.  Genitourinary:  Negative for dysuria, frequency and urgency.       Vaginal bleeding      Objective:  Physical Exam BP 112/75   Pulse 75   Ht 5\' 2"  (1.575 m)   Wt 174 lb 3.2 oz (79 kg)   BMI 31.86 kg/m  Physical Exam Constitutional:      Appearance: Normal appearance. She is normal weight.  Cardiovascular:     Rate and Rhythm: Normal rate.  Pulmonary:     Effort: Pulmonary effort is normal.  Abdominal:     General: Abdomen is flat.     Palpations: Abdomen is soft.  Genitourinary:    Comments: deferred Psychiatric:        Mood and Affect: Mood normal.        Behavior: Behavior normal.        Thought Content: Thought content normal.        Judgment: Judgment normal.     Labs and Imaging No results found for this or any previous visit (from the past 24 hour(s)).  No results found.   Assessment & Plan:  1. Breakthrough bleeding on Nexplanon - Discussed breakthrough bleeding can be normal the first 2-3 months after initiation. Plan for patient to start combine hormonal contraceptive for once month to assist with hormonal regulation. Additional labs collected to rule out other cause of BTB. Plan for patient  to return in office in 2-3 weeks to assess for improvement in symptoms. Coaching provided on use of Celebrex should patient have heavy breakthrough bleeding in the future.  - celecoxib (CELEBREX) 200 MG capsule; Take 1 capsule (200 mg total) by mouth daily for 10 days.  Dispense: 10 capsule; Refill: 0 - norgestimate-ethinyl estradiol (ORTHO-CYCLEN) 0.25-35 MG-MCG tablet; Take 1 tablet by mouth daily.  Dispense: 28 tablet; Refill: 1 - CBC - TSH Rfx on Abnormal to Free T4   12-01-1997, NP 11/13/2021 5:09 PM

## 2021-11-13 NOTE — Progress Notes (Signed)
Pt presents for bleeding while on Nexplanon. Nexplanon inserted 09/18/21. Pt states she has been bleeding for 2 and a half week. Period is painful. Pt states bleeding is heavy and she  states she has been changing pads every 30 minutes.

## 2021-11-14 LAB — TSH RFX ON ABNORMAL TO FREE T4: TSH: 0.681 u[IU]/mL (ref 0.450–4.500)

## 2021-11-14 LAB — CBC
Hematocrit: 34.7 % (ref 34.0–46.6)
Hemoglobin: 11.4 g/dL (ref 11.1–15.9)
MCH: 27.8 pg (ref 26.6–33.0)
MCHC: 32.9 g/dL (ref 31.5–35.7)
MCV: 85 fL (ref 79–97)
Platelets: 313 10*3/uL (ref 150–450)
RBC: 4.1 x10E6/uL (ref 3.77–5.28)
RDW: 12.7 % (ref 11.7–15.4)
WBC: 9 10*3/uL (ref 3.4–10.8)

## 2021-12-04 ENCOUNTER — Ambulatory Visit: Payer: Medicaid Other | Admitting: Student

## 2021-12-26 ENCOUNTER — Encounter: Payer: Self-pay | Admitting: Obstetrics and Gynecology

## 2021-12-26 ENCOUNTER — Ambulatory Visit (INDEPENDENT_AMBULATORY_CARE_PROVIDER_SITE_OTHER): Payer: Commercial Managed Care - HMO | Admitting: Obstetrics and Gynecology

## 2021-12-26 VITALS — BP 126/72 | HR 82 | Ht 62.0 in | Wt 171.0 lb

## 2021-12-26 DIAGNOSIS — N921 Excessive and frequent menstruation with irregular cycle: Secondary | ICD-10-CM

## 2021-12-26 DIAGNOSIS — Z3046 Encounter for surveillance of implantable subdermal contraceptive: Secondary | ICD-10-CM | POA: Diagnosis not present

## 2021-12-26 DIAGNOSIS — Z975 Presence of (intrauterine) contraceptive device: Secondary | ICD-10-CM

## 2021-12-26 NOTE — Progress Notes (Signed)
Pt is in the office to follow up from visit on 11/13/21. Pt reports that bleeding improved with BC pills, but feels like bleeding will start again if she stops.

## 2021-12-27 ENCOUNTER — Encounter: Payer: Self-pay | Admitting: Obstetrics and Gynecology

## 2021-12-30 ENCOUNTER — Encounter: Payer: Self-pay | Admitting: Obstetrics and Gynecology

## 2021-12-30 NOTE — Progress Notes (Signed)
  GYNECOLOGY PROGRESS NOTE  History:  Sheri Gilbert is a 31 y.o. [id number] presents to [facility] office today for follow-up to a previous problem gyn visit. She reports the heavy vaginal bleeding stopped 1 week after she started taking the birth control pills. She has continued to take the birth control pills daily. She is concerned that if she stops taking the birth control pills, the heavy vaginal bleeding will return. She wants to know what other options she has.  She denies h/a, dizziness, shortness of breath, n/v, or fever/chills.    The following portions of the patient's history were reviewed and updated as appropriate: allergies, current medications, past family history, past medical history, past social history, past surgical history and problem list.  Review of Systems:  Pertinent items are noted in HPI.   Objective:  Physical Exam Blood pressure 126/72, pulse 82, height 5\' 2"  (1.575 m), weight 171 lb (77.6 kg). VS reviewed, nursing note reviewed,  Constitutional: well developed, well nourished, no distress HEENT: normocephalic CV: normal rate Pulm/chest wall: normal effort Breast Exam: deferred Abdomen: deferred Neuro: alert and oriented x 3 Skin: warm, dry Psych: affect normal Pelvic exam: deferred  Assessment & Plan:  Breakthrough bleeding on Nexplanon  - Discussed with patient to continue taking CHC's as previously Rx'd and see what her bleeding pattern will be at course completion and changing methods - Patient to f/u prn or sooner if bleeding worsens  Total face-to-face time spent during this encounter was 10 minutes. There was 5 minutes of chart review time spent prior to this encounter. Total time spent = 15 minutes.    Laury Deep, CNM 1:45 PM

## 2023-05-26 ENCOUNTER — Other Ambulatory Visit (HOSPITAL_COMMUNITY)
Admission: RE | Admit: 2023-05-26 | Discharge: 2023-05-26 | Disposition: A | Discharge status: Home / Self Care | Source: Ambulatory Visit | Attending: Obstetrics and Gynecology | Admitting: Obstetrics and Gynecology

## 2023-05-26 ENCOUNTER — Ambulatory Visit: Payer: Self-pay | Admitting: Obstetrics and Gynecology

## 2023-05-26 ENCOUNTER — Encounter: Payer: Self-pay | Admitting: Obstetrics and Gynecology

## 2023-05-26 VITALS — BP 114/77 | HR 99 | Ht 62.0 in | Wt 177.0 lb

## 2023-05-26 DIAGNOSIS — N898 Other specified noninflammatory disorders of vagina: Secondary | ICD-10-CM

## 2023-05-26 DIAGNOSIS — Z3046 Encounter for surveillance of implantable subdermal contraceptive: Secondary | ICD-10-CM | POA: Diagnosis not present

## 2023-05-26 DIAGNOSIS — Z124 Encounter for screening for malignant neoplasm of cervix: Secondary | ICD-10-CM

## 2023-05-26 DIAGNOSIS — Z3009 Encounter for other general counseling and advice on contraception: Secondary | ICD-10-CM

## 2023-05-26 DIAGNOSIS — Z01419 Encounter for gynecological examination (general) (routine) without abnormal findings: Secondary | ICD-10-CM | POA: Diagnosis not present

## 2023-05-26 DIAGNOSIS — Z760 Encounter for issue of repeat prescription: Secondary | ICD-10-CM | POA: Diagnosis present

## 2023-05-26 DIAGNOSIS — Z113 Encounter for screening for infections with a predominantly sexual mode of transmission: Secondary | ICD-10-CM

## 2023-05-26 MED ORDER — NORETHIN ACE-ETH ESTRAD-FE 1-20 MG-MCG(24) PO TABS: 1.0000 | ORAL_TABLET | Freq: Every day | ORAL | 11 refills | Status: DC

## 2023-05-26 NOTE — Progress Notes (Signed)
 Pt is in office for Nexplanon removal - due to insurance pt will need AEX prior to birth control changes.  Pt would like to switch to a pill for [facility]. Pt has been having nausea, HA's and irregular heavy bleeding.   Last pap 07/15/21 with [facility] - normal results.

## 2023-05-26 NOTE — Progress Notes (Signed)
 ANNUAL EXAM Patient name: Sheri Gilbert MRN [id number]  Date of birth: 10/21/1990 Chief Complaint:   Annual Exam and Contraception  History of Present Illness:   Sheri Gilbert is a 33 y.o. [phone] being seen today for a routine annual exam.  Current complaints: annual, nexplanon removal, recurrent BV  Menstrual concerns? No  mostly amenorrheic with nexplanon, briefly used OCPs with nexplanon in place for breakthrough bleeding Breast or nipple changes? No  Contraception use? Yes nexplanon, due for removal and would like to switch contraception  Sexually active? Yes female partner, no pain with intercourse  Has been using nexplanon's for 8 years and would like to switch to pills at this time. Most recent nexplanon placed in 2022. Mostly amenorrheic with nexplanon in place but when she does have bleeding it can be heavy. Also noted to have been having nausea and HA. No prior use of OCPs for contraception. No CHC contraindications.   No LMP recorded. Patient has had an implant.   The pregnancy intention screening data noted above was reviewed. Potential methods of contraception were discussed. The patient elected to proceed with No data recorded.   Last pap  07/15/2021 Negative  H/O abnormal pap: no Last mammogram: n/a.  Last colonoscopy: n/a.       No data to display               No data to display           Review of Systems:   Pertinent items are noted in HPI Denies any headaches, blurred vision, fatigue, shortness of breath, chest pain, abdominal pain, abnormal vaginal discharge/itching/odor/irritation, problems with periods, bowel movements, urination, or intercourse unless otherwise stated above. Pertinent History Reviewed:  Reviewed past medical,surgical, social and family history.  Reviewed problem list, medications and allergies. Physical Assessment:   Vitals:   05/26/23 0846  BP: 114/77  Pulse: 99  Weight: 177 lb (80.3 kg)  Height: 5\' 2"  (1.575 m)   Body mass index is 32.37 kg/m.        Physical Examination:   General appearance - well appearing, and in no distress  Mental status - alert, oriented to person, place, and time  Psych:  She has a normal mood and affect  Skin - warm and dry, normal color, no suspicious lesions noted  Chest - effort normal, all lung fields clear to auscultation bilaterally  Heart - normal rate and regular rhythm  Abdomen - soft, nontender, nondistended, no masses or organomegaly  Pelvic -  VULVA: normal appearing vulva with no masses, tenderness or lesions   VAGINA: normal appearing vagina with normal color and discharge, no lesions   CERVIX: limited visualization of cervix, moderate white discharge noted  Thin prep pap is done with HR HPV cotesting  Extremities:  No swelling or varicosities noted  Chaperone present for exam  No results found for this or any previous visit (from the past 24 hours).   NEXPLANON REMOVAL The risks (including infection, bleeding, pain, and uterine perforation) and benefits of the procedure were explained to the patient and Written informed consent was obtained.   Device was palpated in left upper arm. After time out, the skin was cleaned with alcohol and infiltrated with 2cc of 1% lidocaine with epinephrine was used to infiltrate the skin and subcutaneous tissue deep to the device. The area was cleaned with betadine x3.  Using an 11 blade, the skin was incised and the implant was removed intact.  The implant was shown to  the patient.  The skin was cleaned, incision covered with Steri-Strips, and an adhesive bandage.  Arm was wrapped and post procedure instructions provided to the patient.  OCPs chose as new contraceptive method.    Assessment & Plan:  1. Well woman exam with routine gynecological exam (Primary) - Cervical cancer screening: Discussed screening Q3 years. Reviewed importance of annual exams and limits of pap smear. Pap with reflex HPV collected - GC/CT:  Discussed and recommended. Pt  accepts - Birth Control: OCPs - Breast Health: Encouraged self breast awareness/exams.  - Follow-up: 12 months and prn   2. Screening for cervical cancer Pap collected - Cytology - PAP( [facility])  3. Screening examination for STI - HIV Antibody (routine testing w rflx) - RPR - Hepatitis B surface antigen - Hepatitis C antibody  4. Nexplanon removal Now s/p uncomplicated nexplanon removal   5. Encounter for counseling regarding contraception The use of the oral contraceptive has been fully discussed with the patient. This includes the proper method to initiate (i.e. Sunday start after next normal menstrual onset versus same day start) and continue the pills, the need for regular compliance to ensure adequate contraceptive effect, the physiology which make the pill effective, the instructions for what to do in event of a missed pill, and warnings about anticipated minor side effects such as breakthrough spotting, nausea, breast tenderness, weight changes, acne, headaches, etc.  They have been told of the more serious potential side effects such as MI, stroke, and deep vein thrombosis, all of which are very unlikely.  They have been asked to report any signs of such serious problems immediately.  They should back up the pill with a condom during any cycle in which antibiotics are prescribed, and during the first cycle as well. The need for additional protection, such as a condom, to prevent exposure to sexually transmitted diseases has also been discussed- the patient has been clearly reminded that OCP's cannot protect them against diseases such as HIV and others. They understand and wish to take the medication as prescribed. They was given a prescription for loestrin and will contact office if there are concerns.   - Norethindrone Acetate-Ethinyl Estrad-FE (LOESTRIN 24 FE) 1-20 MG-MCG(24) tablet; Take 1 tablet by mouth daily.  Dispense: 28 tablet; Refill:  11  6. Vaginal discharge Vaginitis swab collected  - Cervicovaginal ancillary only( [facility])   Orders Placed This Encounter  Procedures   HIV Antibody (routine testing w rflx)   RPR   Hepatitis B surface antigen   Hepatitis C antibody    Meds:  Meds ordered this encounter  Medications   Norethindrone Acetate-Ethinyl Estrad-FE (LOESTRIN 24 FE) 1-20 MG-MCG(24) tablet    Sig: Take 1 tablet by mouth daily.    Dispense:  28 tablet    Refill:  11    Follow-up: Return if symptoms worsen or fail to improve, for GYN Follow Up.  Lorriane Shire, MD 05/26/2023 9:04 AM

## 2023-05-26 NOTE — Patient Instructions (Signed)
 Your NEXPLANON implant was just removed Here is some helpful information on what you can expect and how to care for your removal site. 24 Hours wear your top bandage The compression bandage helps minimize bruising.  3-5 Days keep your implant site covered While the insertion site is healing, keep the area covered with a smaller bandage.

## 2023-05-27 ENCOUNTER — Other Ambulatory Visit: Payer: Self-pay | Admitting: Obstetrics and Gynecology

## 2023-05-27 ENCOUNTER — Encounter: Payer: Self-pay | Admitting: Obstetrics and Gynecology

## 2023-05-27 LAB — CERVICOVAGINAL ANCILLARY ONLY
Bacterial Vaginitis (gardnerella): POSITIVE — AB
Candida Glabrata: NEGATIVE
Candida Vaginitis: NEGATIVE
Chlamydia: NEGATIVE
Comment: NEGATIVE
Comment: NEGATIVE
Comment: NEGATIVE
Comment: NEGATIVE
Comment: NEGATIVE
Comment: NORMAL
Neisseria Gonorrhea: NEGATIVE
Trichomonas: NEGATIVE

## 2023-05-27 LAB — RPR: RPR Ser Ql: NONREACTIVE

## 2023-05-27 LAB — HIV ANTIBODY (ROUTINE TESTING W REFLEX): HIV Screen 4th Generation wRfx: NONREACTIVE

## 2023-05-27 LAB — HEPATITIS B SURFACE ANTIGEN: Hepatitis B Surface Ag: NEGATIVE

## 2023-05-27 LAB — HEPATITIS C ANTIBODY: Hep C Virus Ab: NONREACTIVE

## 2023-05-27 MED ORDER — CLINDAMYCIN PHOSPHATE 2 % VA CREA: 1.0000 | TOPICAL_CREAM | Freq: Every day | VAGINAL | 0 refills | Status: AC

## 2023-05-28 LAB — CYTOLOGY - PAP
Adequacy: ABSENT
Comment: NEGATIVE
Diagnosis: NEGATIVE
High risk HPV: NEGATIVE

## 2023-06-10 ENCOUNTER — Ambulatory Visit: Admitting: Obstetrics & Gynecology

## 2023-06-14 ENCOUNTER — Encounter (HOSPITAL_BASED_OUTPATIENT_CLINIC_OR_DEPARTMENT_OTHER): Payer: Self-pay

## 2023-06-14 ENCOUNTER — Other Ambulatory Visit: Payer: Self-pay

## 2023-06-14 ENCOUNTER — Emergency Department (HOSPITAL_BASED_OUTPATIENT_CLINIC_OR_DEPARTMENT_OTHER)
Admission: EM | Admit: 2023-06-14 | Discharge: 2023-06-14 | Disposition: A | Discharge status: Home / Self Care | Payer: Self-pay | Attending: Emergency Medicine | Admitting: Emergency Medicine

## 2023-06-14 ENCOUNTER — Emergency Department (HOSPITAL_BASED_OUTPATIENT_CLINIC_OR_DEPARTMENT_OTHER): Payer: Self-pay

## 2023-06-14 DIAGNOSIS — N83202 Unspecified ovarian cyst, left side: Secondary | ICD-10-CM | POA: Insufficient documentation

## 2023-06-14 LAB — COMPREHENSIVE METABOLIC PANEL WITH GFR
ALT: 14 U/L (ref 0–44)
AST: 12 U/L — ABNORMAL LOW (ref 15–41)
Albumin: 3.9 g/dL (ref 3.5–5.0)
Alkaline Phosphatase: 88 U/L (ref 38–126)
Anion gap: 8 (ref 5–15)
BUN: 12 mg/dL (ref 6–20)
CO2: 23 mmol/L (ref 22–32)
Calcium: 9 mg/dL (ref 8.9–10.3)
Chloride: 106 mmol/L (ref 98–111)
Creatinine, Ser: 0.76 mg/dL (ref 0.44–1.00)
GFR, Estimated: >60 mL/min (ref >60)
Glucose, Bld: 85 mg/dL (ref 70–99)
Potassium: 4 mmol/L (ref 3.5–5.1)
Sodium: 137 mmol/L (ref 135–145)
Total Bilirubin: 0.6 mg/dL (ref 0.0–1.2)
Total Protein: 6.9 g/dL (ref 6.5–8.1)

## 2023-06-14 LAB — URINALYSIS, ROUTINE W REFLEX MICROSCOPIC
Bacteria, UA: NONE SEEN
Bilirubin Urine: NEGATIVE
Glucose, UA: NEGATIVE mg/dL
Ketones, ur: NEGATIVE mg/dL
Leukocytes,Ua: NEGATIVE
Nitrite: NEGATIVE
Protein, ur: NEGATIVE mg/dL
Specific Gravity, Urine: 1.016 (ref 1.005–1.030)
pH: 5.5 (ref 5.0–8.0)

## 2023-06-14 LAB — CBC
HCT: 32.5 % — ABNORMAL LOW (ref 36.0–46.0)
Hemoglobin: 11.1 g/dL — ABNORMAL LOW (ref 12.0–15.0)
MCH: 28.7 pg (ref 26.0–34.0)
MCHC: 34.2 g/dL (ref 30.0–36.0)
MCV: 84 fL (ref 80.0–100.0)
Platelets: 266 10*3/uL (ref 150–400)
RBC: 3.87 MIL/uL (ref 3.87–5.11)
RDW: 12.5 % (ref 11.5–15.5)
WBC: 8.9 10*3/uL (ref 4.0–10.5)
nRBC: 0 % (ref 0.0–0.2)

## 2023-06-14 LAB — LIPASE, BLOOD: Lipase: 22 U/L (ref 11–51)

## 2023-06-14 LAB — PREGNANCY, URINE: Preg Test, Ur: NEGATIVE

## 2023-06-14 MED ORDER — HYDROCODONE-ACETAMINOPHEN 5-325 MG PO TABS: 1.0000 | ORAL_TABLET | Freq: Four times a day (QID) | ORAL | 0 refills | Status: DC | PRN

## 2023-06-14 MED ORDER — DICYCLOMINE HCL 10 MG/ML IM SOLN
20.0000 mg | Freq: Once | INTRAMUSCULAR | Status: AC
  Administered 2023-06-14: 20 mg via INTRAMUSCULAR
  Filled 2023-06-14: qty 2

## 2023-06-14 MED ORDER — IOHEXOL 300 MG/ML  SOLN
100.0000 mL | Freq: Once | INTRAMUSCULAR | Status: AC | PRN
  Administered 2023-06-14: 100 mL via INTRAVENOUS

## 2023-06-14 NOTE — ED Triage Notes (Signed)
 Patient arrives ambulatory to ED with complaints of LLQ abdominal pain x1 day. Patient rates pain a 7/10. Recently had birth control (arm implant) removed earlier this month as well.

## 2023-06-14 NOTE — ED Provider Notes (Signed)
 [name] EMERGENCY DEPARTMENT AT [facility] Provider Note   CSN: [id number] Arrival date & time: 06/14/23  0901     History  Chief Complaint  Patient presents with   Abdominal Pain    Sheri Gilbert is a 33 y.o. female with medical history significant for cholecystectomy 2021, hypercholesterolemia, C-section, recent endoscopy and colonoscopy.  The patient presents to the ED for evaluation of left lower quadrant abdominal pain.  Reports that left lower quadrant pain began yesterday.  States that waxed and waned all day.  Reports she was able to go to sleep but her pain woke her up in middle the night.  She describes the pain as a cramping pain, reports that it occurs intermittently.  Denies any alleviating or aggravating factors.  States she tried taking ibuprofen at home however this did not provide any relief.  She states that she recently had endoscopy and colonoscopy with GI specialist as an outpatient 6 days ago.  She denies any blood in her stool, nausea, vomiting.  Reports her last bowel movement was yesterday, normal.  Denies dysuria, fevers, flank pain, vaginal discharge or bleeding.  She reports that she felt as if her menstrual cycle might been coming on however she has had no vaginal bleeding.  She states that she recently had a Nexplanon implanted into her arm so her menstrual cycles are irregular.   Abdominal Pain Associated symptoms: no chest pain, no diarrhea, no dysuria, no fever, no nausea, no shortness of breath and no vomiting        Home Medications Prior to Admission medications   Medication Sig Start Date End Date Taking? Authorizing Provider  HYDROcodone-acetaminophen (NORCO/VICODIN) 5-325 MG tablet Take 1 tablet by mouth every 6 (six) hours as needed. 06/14/23  Yes Al Decant, PA-C  cholecalciferol (VITAMIN D3) 25 MCG (1000 UNIT) tablet Take 1,000 Units by mouth daily.    [provider]  gemfibrozil (LOPID) 600 MG tablet Take 600 mg  by mouth 2 (two) times daily before a meal.    [provider]  Norethindrone Acetate-Ethinyl Estrad-FE (LOESTRIN 24 FE) 1-20 MG-MCG(24) tablet Take 1 tablet by mouth daily. 05/26/23   Lorriane Shire, MD  omeprazole (PRILOSEC) 20 MG capsule Take 20 mg by mouth in the morning and at bedtime. Patient not taking: Reported on 09/18/2021    [provider]      Allergies    Patient has no known allergies.    Review of Systems   Review of Systems  Constitutional:  Negative for fever.  Respiratory:  Negative for shortness of breath.   Cardiovascular:  Negative for chest pain.  Gastrointestinal:  Positive for abdominal pain. Negative for blood in stool, diarrhea, nausea and vomiting.  Genitourinary:  Negative for dysuria.  All other systems reviewed and are negative.   Physical Exam Updated Vital Signs BP 127/89 (BP Location: Right Arm)   Pulse 74   Temp 98.2 F (36.8 C) (Oral)   Resp 18   Ht 5\' 2"  (1.575 m)   Wt 80.3 kg   LMP  (LMP Unknown)   SpO2 98%   BMI 32.37 kg/m  Physical Exam Vitals and nursing note reviewed.  Constitutional:      General: She is not in acute distress.    Appearance: She is well-developed.  HENT:     Head: Normocephalic and atraumatic.  Eyes:     Conjunctiva/sclera: Conjunctivae normal.  Cardiovascular:     Rate and Rhythm: Normal rate and regular rhythm.  Heart sounds: No murmur heard. Pulmonary:     Effort: Pulmonary effort is normal. No respiratory distress.     Breath sounds: Normal breath sounds.  Abdominal:     Palpations: Abdomen is soft.     Tenderness: There is abdominal tenderness.     Comments: LLQ tenderness.  No overlying skin change, no rebound or guarding.  Negative CVA tenderness bilaterally.  Musculoskeletal:        General: No swelling.     Cervical back: Neck supple.  Skin:    General: Skin is warm and dry.     Capillary Refill: Capillary refill takes less than 2 seconds.  Neurological:     Mental  Status: She is alert and oriented to person, place, and time.  Psychiatric:        Mood and Affect: Mood normal.     ED Results / Procedures / Treatments   Labs (all labs ordered are listed, but only abnormal results are displayed) Labs Reviewed  COMPREHENSIVE METABOLIC PANEL WITH GFR - Abnormal; Notable for the following components:      Result Value   AST 12 (*)    All other components within normal limits  CBC - Abnormal; Notable for the following components:   Hemoglobin 11.1 (*)    HCT 32.5 (*)    All other components within normal limits  URINALYSIS, ROUTINE W REFLEX MICROSCOPIC - Abnormal; Notable for the following components:   Hgb urine dipstick TRACE (*)    All other components within normal limits  LIPASE, BLOOD  PREGNANCY, URINE    EKG None  Radiology US PELVIC COMPLETE W TRANSVAGINAL AND TORSION R/O Result Date: 06/14/2023 CLINICAL DATA:  Left lower quadrant abdominal pain. EXAM: TRANSABDOMINAL AND TRANSVAGINAL ULTRASOUND OF PELVIS DOPPLER ULTRASOUND OF OVARIES TECHNIQUE: Both transabdominal and transvaginal ultrasound examinations of the pelvis were performed. Transabdominal technique was performed for global imaging of the pelvis including uterus, ovaries, adnexal regions, and pelvic cul-de-sac. It was necessary to proceed with endovaginal exam following the transabdominal exam to visualize the uterus, endometrium, and ovaries. Color and duplex Doppler ultrasound was utilized to evaluate blood flow to the ovaries. COMPARISON:  CT abdomen pelvis from same day. FINDINGS: Uterus Measurements: 8.9 x 3.5 x 4.3 cm = volume: 70 mL. No fibroids or other mass visualized. Endometrium Thickness: 7 mm.  No focal abnormality visualized. Right ovary Not visualized, but normal in appearance on same day CT. Left ovary Measurements: 3.2 x 2.6 x 2.2 cm = volume: 9.1 mL. Normal appearance/no adnexal mass. Corpus luteum noted. Pulsed Doppler evaluation of the left ovary demonstrates normal  low-resistance arterial and venous waveforms. Other findings Trace free fluid in the pelvis, likely physiologic. IMPRESSION: 1. No acute abnormality. No evidence of ovarian torsion. 2. Nonvisualization of the right ovary, although it has a normal appearance on same day CT. Electronically Signed   By: Obie Dredge M.D.   On: 06/14/2023 12:51   CT ABDOMEN PELVIS W CONTRAST Result Date: 06/14/2023 CLINICAL DATA:  LLQ abdominal pain.  Abdominal cramping. EXAM: CT ABDOMEN AND PELVIS WITH CONTRAST TECHNIQUE: Multidetector CT imaging of the abdomen and pelvis was performed using the standard protocol following bolus administration of intravenous contrast. RADIATION DOSE REDUCTION: This exam was performed according to the departmental dose-optimization program which includes automated exposure control, adjustment of the mA and/or kV according to patient size and/or use of iterative reconstruction technique. CONTRAST:  OMNIPAQUE IOHEXOL 300 MG/ML  SOLN COMPARISON:  None Available. FINDINGS: Lower chest: There are  minimal dependent changes in the visualized lung bases. No overt consolidation. No pleural effusion. The heart is normal in size. No pericardial effusion. Hepatobiliary: The liver is normal in size. Non-cirrhotic configuration. No suspicious mass. These is mild diffuse hepatic steatosis. No intrahepatic or extrahepatic bile duct dilation. Gallbladder is surgically absent. Pancreas: Unremarkable. No pancreatic ductal dilatation or surrounding inflammatory changes. Spleen: Within normal limits. No focal lesion. Adrenals/Urinary Tract: Adrenal glands are unremarkable. No suspicious renal mass. There is a 1.1 x 1.3 cm simple cyst in the right kidney lower pole. No hydronephrosis. No renal or ureteric calculi. Unremarkable urinary bladder. Stomach/Bowel: No disproportionate dilation of the small or large bowel loops. No evidence of abnormal bowel wall thickening or inflammatory changes. The appendix is  unremarkable. Vascular/Lymphatic: No ascites or pneumoperitoneum. No abdominal or pelvic lymphadenopathy, by size criteria. No aneurysmal dilation of the major abdominal arteries. Reproductive: Evaluation of reproductive organs is limited on the CT scan exam. However, having said that, normal-sized anteverted uterus is noted. There is a crenulated hyperattenuating walled structure in the left ovary, favored to represent a corpus luteal cyst. There is small amount of low-attenuation fluid surrounding the left ovary and in the dependent portion of the cul de sac which are favored physiological in the patient of this age. No right adnexal mass seen. Other: The visualized soft tissues and abdominal wall are unremarkable. Musculoskeletal: No suspicious osseous lesions. IMPRESSION: 1. There is a crenulated hyperattenuating structure in the left ovary, favored to represent a corpus luteal cyst. There is small amount of low-attenuation fluid surrounding the left ovary and in the dependent portion of the cul de sac which are favored physiological in the patient of this age. Finding is favored incidental. Correlate clinically. 2. No other acute inflammatory process identified within the abdomen or pelvis. 3. Multiple other nonacute observations, as described above. Electronically Signed   By: Jules Schick M.D.   On: 06/14/2023 10:55    Procedures Procedures   Medications Ordered in ED Medications  dicyclomine (BENTYL) injection 20 mg (20 mg Intramuscular Given 06/14/23 0958)  iohexol (OMNIPAQUE) 300 MG/ML solution 100 mL (100 mLs Intravenous Contrast Given 06/14/23 1034)    ED Course/ Medical Decision Making/ A&P  Medical Decision Making Amount and/or Complexity of Data Reviewed Labs: ordered. Radiology: ordered.  Risk Prescription drug management.   33 year old female presents for evaluation.  Please see HPI for further details.  On examination patient is afebrile and nontachycardic.  Her lung sounds  are clear bilaterally, she is not hypoxic.  Abdomen soft and compressible with tenderness in the left lower quadrant.  No CVA tenderness bilaterally.  Overall nontoxic in appearance.  Neurological examinations baseline.  Reassuring vital signs.  Will collect CT scan of patient abdomen due to location of pain, concern for diverticulitis.  Will also collect CBC, CMP, lipase and urinalysis.  Will provide patient pain control with Bentyl.  CBC without leukocytosis, baseline hemoglobin 11.1.  Metabolic panel without electrolyte derangement.  Urinalysis shows trace hemoglobin however no other findings, no nitrites or leukocytes.  Lipase WNL at 22.  CT scan of patient abdomen unremarkable, shows no signs of acute process.  There is a left-sided corpus luteal cyst.  Will collect ultrasound imaging of pelvis to rule out torsion.  Ultrasound imaging shows no evidence of torsion.  Will discharge patient home at this time.  Will have her follow-up with OB/GYN.  Luteal cyst most likely cause of patient pain, corresponds with location of pain.  Patient recent home with  some pain medication.  Will have her follow-up with OB/GYN.  Return precautions provided and she voiced understanding.  She is stable to discharge.   Final Clinical Impression(s) / ED Diagnoses Final diagnoses:  Cyst of left ovary    Rx / DC Orders ED Discharge Orders          Ordered    HYDROcodone-acetaminophen (NORCO/VICODIN) 5-325 MG tablet  Every 6 hours PRN        06/14/23 1316              Al Decant, [place] 06/14/23 1317    Tegeler, Canary Brim, MD 06/14/23 [phone]

## 2023-06-14 NOTE — ED Notes (Signed)
 Discharge paperwork given and verbally understood.

## 2023-06-14 NOTE — Discharge Instructions (Addendum)
 It was a pleasure taking part in your care.  As discussed, your workup is reassuring.  The most likely source of your pain is due to your corpus luteal cyst.  Please follow-up with your OB/GYN.  Please take pain medication as prescribed every 6 hours, 1 tablet of hydrocodone.  Please do not drive or operate heavy machinery on this medication.  I prefer that you take this medication at night before bed to help sleep.  I would recommend that you start trying to control pain with ibuprofen or Tylenol every 6 hours.  Please return to the ED with any new or worsening symptoms.

## 2023-06-15 ENCOUNTER — Encounter: Payer: Self-pay | Admitting: Obstetrics and Gynecology

## 2023-12-22 ENCOUNTER — Ambulatory Visit (INDEPENDENT_AMBULATORY_CARE_PROVIDER_SITE_OTHER)

## 2023-12-22 ENCOUNTER — Encounter (HOSPITAL_BASED_OUTPATIENT_CLINIC_OR_DEPARTMENT_OTHER): Payer: Self-pay | Admitting: Obstetrics & Gynecology

## 2023-12-22 ENCOUNTER — Encounter (HOSPITAL_BASED_OUTPATIENT_CLINIC_OR_DEPARTMENT_OTHER): Payer: Self-pay

## 2023-12-22 VITALS — BP 110/74 | HR 91 | Wt 167.0 lb

## 2023-12-22 DIAGNOSIS — O3680X0 Pregnancy with inconclusive fetal viability, not applicable or unspecified: Secondary | ICD-10-CM

## 2023-12-22 DIAGNOSIS — Z3201 Encounter for pregnancy test, result positive: Secondary | ICD-10-CM | POA: Diagnosis not present

## 2023-12-22 DIAGNOSIS — Z3200 Encounter for pregnancy test, result unknown: Secondary | ICD-10-CM

## 2023-12-22 LAB — POCT URINE PREGNANCY: Preg Test, Ur: POSITIVE — AB

## 2023-12-22 NOTE — Progress Notes (Signed)
 NURSE VISIT- PREGNANCY CONFIRMATION   SUBJECTIVE:  Sheri Gilbert is a 33 y.o. [id number] female at [redacted]w[redacted]d by approximate LMP 11/20/2023. Patient's last menstrual period was 11/20/2023 (approximate). Here for pregnancy confirmation.  Home pregnancy test: positive x 5  She reports no complaints.  She is taking prenatal vitamins.    OBJECTIVE:  BP 110/74 (BP Location: Left Arm, Patient Position: Sitting, Cuff Size: Normal)   Pulse 91   Wt 167 lb (75.8 kg)   LMP 11/20/2023 (Approximate)   SpO2 97%   BMI 30.54 kg/m   Appears well, in no apparent distress  Results for orders placed or performed in visit on 12/22/23 (from the past 24 hours)  POCT urine pregnancy   Collection Time: 12/22/23  8:43 AM  Result Value Ref Range   Preg Test, Ur Positive (A) Negative    ASSESSMENT: Positive pregnancy test. Approximate LMP 11/20/2023.  PLAN: Prenatal vitamins: continue   Nausea medicines: not currently needed

## 2023-12-29 ENCOUNTER — Encounter (HOSPITAL_BASED_OUTPATIENT_CLINIC_OR_DEPARTMENT_OTHER): Payer: Self-pay | Admitting: Certified Nurse Midwife

## 2024-01-11 ENCOUNTER — Emergency Department (HOSPITAL_BASED_OUTPATIENT_CLINIC_OR_DEPARTMENT_OTHER)

## 2024-01-11 ENCOUNTER — Emergency Department (HOSPITAL_BASED_OUTPATIENT_CLINIC_OR_DEPARTMENT_OTHER)
Admission: EM | Admit: 2024-01-11 | Discharge: 2024-01-11 | Disposition: A | Discharge status: Home / Self Care | Attending: Emergency Medicine | Admitting: Emergency Medicine

## 2024-01-11 ENCOUNTER — Encounter (HOSPITAL_BASED_OUTPATIENT_CLINIC_OR_DEPARTMENT_OTHER): Payer: Self-pay

## 2024-01-11 ENCOUNTER — Other Ambulatory Visit: Payer: Self-pay

## 2024-01-11 DIAGNOSIS — O209 Hemorrhage in early pregnancy, unspecified: Secondary | ICD-10-CM | POA: Diagnosis present

## 2024-01-11 DIAGNOSIS — O208 Other hemorrhage in early pregnancy: Secondary | ICD-10-CM | POA: Diagnosis not present

## 2024-01-11 DIAGNOSIS — Z3A01 Less than 8 weeks gestation of pregnancy: Secondary | ICD-10-CM | POA: Diagnosis not present

## 2024-01-11 LAB — CBC WITH DIFFERENTIAL/PLATELET
Abs Immature Granulocytes: 0.07 K/uL (ref 0.00–0.07)
Basophils Absolute: 0 K/uL (ref 0.0–0.1)
Basophils Relative: 0 %
Eosinophils Absolute: 0.1 K/uL (ref 0.0–0.5)
Eosinophils Relative: 1 %
HCT: 30.3 % — ABNORMAL LOW (ref 36.0–46.0)
Hemoglobin: 10.4 g/dL — ABNORMAL LOW (ref 12.0–15.0)
Immature Granulocytes: 1 %
Lymphocytes Relative: 18 %
Lymphs Abs: 2.2 K/uL (ref 0.7–4.0)
MCH: 28.6 pg (ref 26.0–34.0)
MCHC: 34.3 g/dL (ref 30.0–36.0)
MCV: 83.2 fL (ref 80.0–100.0)
Monocytes Absolute: 0.7 K/uL (ref 0.1–1.0)
Monocytes Relative: 6 %
Neutro Abs: 9.4 K/uL — ABNORMAL HIGH (ref 1.7–7.7)
Neutrophils Relative %: 74 %
Platelets: 268 K/uL (ref 150–400)
RBC: 3.64 MIL/uL — ABNORMAL LOW (ref 3.87–5.11)
RDW: 12.7 % (ref 11.5–15.5)
WBC: 12.5 K/uL — ABNORMAL HIGH (ref 4.0–10.5)
nRBC: 0 % (ref 0.0–0.2)

## 2024-01-11 LAB — HCG, QUANTITATIVE, PREGNANCY: hCG, Beta Chain, Quant, S: 114673 m[IU]/mL — ABNORMAL HIGH (ref <5)

## 2024-01-11 NOTE — ED Notes (Signed)
 Pt d/c instructions, medications, and follow-up care reviewed with pt. Pt verbalized understanding and had no further questions at time of d/c. Pt CA&Ox4, ambulatory, and in NAD at time of d/c

## 2024-01-11 NOTE — ED Triage Notes (Signed)
 Pt c/o vaginal bleeding, I think I'm currently going through a miscarriage. Pt reports being [redacted]w[redacted]d, G5P2. Vaginal bleeding onset approx 1hr ago, just put a pad on so I need to see how much I'm losing. 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

## 2024-01-11 NOTE — ED Provider Notes (Signed)
  EMERGENCY DEPARTMENT AT [facility] Provider Note   CSN: [id number] Arrival date & time: 01/11/24  8165     Patient presents with: Vaginal Bleeding and Threatened Miscarriage   Sheri Gilbert is a 33 y.o. female.    Vaginal Bleeding    33 year old female G5P2 7 weeks 4 days pregnant by LMP presenting to the emergency department with vaginal bleeding and cramping.  The patient is concerned she is having a miscarriage.  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  She has gone through 1 pad and soaked through her close previously.  She is having ongoing bleeding.  She endorses mild bilateral lower abdominal cramping.  Prior to Admission medications   Medication Sig Start Date End Date Taking? Authorizing Provider  cholecalciferol (VITAMIN D3) 25 MCG (1000 UNIT) tablet Take 1,000 Units by mouth daily. Patient not taking: Reported on 12/22/2023    [provider]  gemfibrozil (LOPID) 600 MG tablet Take 600 mg by mouth 2 (two) times daily before a meal. Patient not taking: Reported on 12/22/2023    [provider]  HYDROcodone -acetaminophen  (NORCO/VICODIN) 5-325 MG tablet Take 1 tablet by mouth every 6 (six) hours as needed. Patient not taking: Reported on 12/22/2023 06/14/23   Ruthell Lonni FALCON, PA-C  Norethindrone Acetate-Ethinyl Estrad-FE (LOESTRIN 24 FE) 1-20 MG-MCG(24) tablet Take 1 tablet by mouth daily. Patient not taking: Reported on 12/22/2023 05/26/23   Ajewole, Christana, MD  omeprazole (PRILOSEC) 20 MG capsule Take 20 mg by mouth in the morning and at bedtime. Patient not taking: Reported on 12/22/2023    [provider]    Allergies: Patient has no known allergies.    Review of Systems  Genitourinary:  Positive for vaginal bleeding.  All other systems reviewed and are negative.   Updated Vital Signs BP 116/70 (BP Location: Right Arm)   Pulse 84   Temp 98 F (36.7 C)   Resp 16   LMP 11/20/2023 (Approximate)    SpO2 100%   Physical Exam Vitals and nursing note reviewed. Exam conducted with a chaperone present.  Constitutional:      General: She is not in acute distress. HENT:     Head: Normocephalic and atraumatic.  Eyes:     Conjunctiva/sclera: Conjunctivae normal.     Pupils: Pupils are equal, round, and reactive to light.  Cardiovascular:     Rate and Rhythm: Normal rate and regular rhythm.  Pulmonary:     Effort: Pulmonary effort is normal. No respiratory distress.  Abdominal:     General: There is no distension.     Tenderness: There is abdominal tenderness. There is no guarding.     Comments: Very mild bilateral lower abdominal tenderness to palpation, no rebound or guarding  Genitourinary:    Comments: Pelvic exam was performed with a chaperone bedside and the cervix was visualized closed, mild active bleeding was noted. Musculoskeletal:        General: No deformity or signs of injury.     Cervical back: Neck supple.  Skin:    Findings: No lesion or rash.  Neurological:     General: No focal deficit present.     Mental Status: She is alert. Mental status is at baseline.     (all labs ordered are listed, but only abnormal results are displayed) Labs Reviewed  HCG, QUANTITATIVE, PREGNANCY - Abnormal; Notable for the following components:      Result Value   hCG, Beta Denis Earleen RAMAN [phone] (*)  All other components within normal limits  CBC WITH DIFFERENTIAL/PLATELET - Abnormal; Notable for the following components:   WBC 12.5 (*)    RBC 3.64 (*)    Hemoglobin 10.4 (*)    HCT 30.3 (*)    Neutro Abs 9.4 (*)    All other components within normal limits    EKG: None  Radiology: US  OB LESS THAN 14 WEEKS WITH OB TRANSVAGINAL Result Date: 01/11/2024 CLINICAL DATA:  Vaginal bleeding EXAM: OBSTETRIC <14 WK US  AND TRANSVAGINAL OB US  TECHNIQUE: Both transabdominal and transvaginal ultrasound examinations were performed for complete evaluation of the gestation as well as  the maternal uterus, adnexal regions, and pelvic cul-de-sac. Transvaginal technique was performed to assess early pregnancy. COMPARISON:  None Available. FINDINGS: Intrauterine gestational sac: Single intrauterine gestation Yolk sac:  Visualized Embryo:  Visualized Cardiac Activity: Visualized Heart Rate: 144 bpm CRL:  10.4 mm   7 w   1 d                  US  EDC: 08/28/2024 Subchorionic hemorrhage:  Small subchorionic hemorrhage Maternal uterus/adnexae: Ovaries are within normal limits. Right ovary measures 3.6 x 1.6 x 1.6 cm. Left ovary measures 2.9 x 2.3 x 1.8 cm. No significant free fluid IMPRESSION: Single intrauterine pregnancy with fetal cardiac activity. Small subchorionic hemorrhage Electronically Signed   By: Luke Bun M.D.   On: 01/11/2024 20:59     Procedures   Medications Ordered in the ED - No data to display                                  Medical Decision Making Amount and/or Complexity of Data Reviewed Labs: ordered. Radiology: ordered.     33 year old female G5P2 7 weeks 4 days pregnant by LMP presenting to the emergency department with vaginal bleeding and cramping.  The patient is concerned she is having a miscarriage.  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  She has gone through 1 pad and soaked through her close previously.  She is having ongoing bleeding.  She endorses mild bilateral lower abdominal cramping.  On arrival, the patient was vitally stable.  On exam the patient had mild lower abdominal tenderness.  Presenting with vaginal bleeding in the setting of pregnancy concerning for miscarriage. Pelvic exam was performed with a chaperone bedside and the cervix was visualized closed, mild active bleeding was noted.  CBC revealed no significant anemia requiring blood transfusion, hemoglobin 10.4.  hCG was appropriately elevated at 114,673  Pelvic ultrasound: IMPRESSION:  Single intrauterine pregnancy with fetal cardiac activity. Small  subchorionic  hemorrhage   Informed patient of diagnosis of threatened miscarriage however reassuring with intrauterine pregnancy and cardiac activity noted.  Small subchorionic hemorrhage was noted on ultrasound.  Patient advised to follow-up with her OB/GYN within the next week for repeat ultrasound and recheck of her hCG for continued outpatient management.  All questions answered prior to discharge.     Final diagnoses:  Vaginal bleeding in pregnancy, first trimester  Subchorionic hemorrhage of placenta in first trimester    ED Discharge Orders     None          Jerrol Agent, MD 01/11/24 2113

## 2024-01-11 NOTE — Discharge Instructions (Addendum)
 Your ultrasound revealed a single live intrauterine pregnancy with fetal cardiac activity noted.  Please follow-up with your OB/GYN within the next week for recheck.  Your US  results: IMPRESSION:  Single intrauterine pregnancy with fetal cardiac activity. Small  subchorionic hemorrhage

## 2024-01-12 ENCOUNTER — Telehealth (HOSPITAL_BASED_OUTPATIENT_CLINIC_OR_DEPARTMENT_OTHER): Payer: Self-pay

## 2024-01-12 NOTE — Telephone Encounter (Signed)
 Spoke with patient. Patient was seen in the ED yesterday for vaginal bleeding and cramping in early pregnancy ([redacted]w[redacted]d). US  OB less than 14 weeks with OB transvaginal was performed which showed single intrauterine pregnancy with fetal cardiac activity. Small subchorionic hemorrhage. ED discharge note recommends follow up in office in 1 week with repeat US  and HCG level. Patient reports having light bleeding today and is passing small clots the size of her finger tip. States she is having mild bilateral cramping. Denies any heavy bleeding, sharp pain, fever, or chills. Patient has an ultrasound scheduled with our office on 01/20/2024. Advised will review with MD and return call for recommendations on follow up. Patient is agreeable.

## 2024-01-12 NOTE — Telephone Encounter (Signed)
 Pt called and stated that she was seen in the ED last night and was told to follow up with her OB today. She wants to know if she should come in. I told her that a nurse would give her a call.

## 2024-01-12 NOTE — Telephone Encounter (Signed)
 Patient would like for someone to call her back. A message was sent this morning.

## 2024-01-13 NOTE — Telephone Encounter (Signed)
 Spoke with patient. Advised patient I spoke with Dr.Miller who recommends no pushing, lifting, pulling or anything in the vagina at this time. Needs to be on pelvic rest. Patient states that she is O+. She has never had to receive a Rhogam injections before. States that she is having very light dark spotting. Denies any bright red vaginal bleeding, pain, or passing of any clots at this time. Advised to continue to monitor. If she develops any new or worsening symptoms she will need to be seen in the office or at [facility]. Advised patient to keep her ultrasound appointment as scheduled for 01/20/2024 at 8:30 am. Patient is agreeable.

## 2024-01-20 ENCOUNTER — Encounter (HOSPITAL_BASED_OUTPATIENT_CLINIC_OR_DEPARTMENT_OTHER): Payer: Self-pay | Admitting: Obstetrics & Gynecology

## 2024-01-20 ENCOUNTER — Other Ambulatory Visit (HOSPITAL_BASED_OUTPATIENT_CLINIC_OR_DEPARTMENT_OTHER)

## 2024-01-20 ENCOUNTER — Ambulatory Visit (HOSPITAL_BASED_OUTPATIENT_CLINIC_OR_DEPARTMENT_OTHER): Admitting: Obstetrics & Gynecology

## 2024-01-20 VITALS — BP 114/73 | HR 100 | Wt 170.0 lb

## 2024-01-20 DIAGNOSIS — O99011 Anemia complicating pregnancy, first trimester: Secondary | ICD-10-CM | POA: Diagnosis not present

## 2024-01-20 DIAGNOSIS — O209 Hemorrhage in early pregnancy, unspecified: Secondary | ICD-10-CM

## 2024-01-20 DIAGNOSIS — Z3A08 8 weeks gestation of pregnancy: Secondary | ICD-10-CM | POA: Diagnosis not present

## 2024-01-20 DIAGNOSIS — O3680X0 Pregnancy with inconclusive fetal viability, not applicable or unspecified: Secondary | ICD-10-CM

## 2024-01-20 DIAGNOSIS — O418X10 Other specified disorders of amniotic fluid and membranes, first trimester, not applicable or unspecified: Secondary | ICD-10-CM

## 2024-01-20 DIAGNOSIS — O208 Other hemorrhage in early pregnancy: Secondary | ICD-10-CM

## 2024-01-20 DIAGNOSIS — Z3A00 Weeks of gestation of pregnancy not specified: Secondary | ICD-10-CM | POA: Diagnosis not present

## 2024-01-20 DIAGNOSIS — D649 Anemia, unspecified: Secondary | ICD-10-CM

## 2024-01-20 MED ORDER — IRON (FERROUS SULFATE) 325 (65 FE) MG PO TABS: 1.0000 | ORAL_TABLET | ORAL | Status: AC

## 2024-01-20 MED ORDER — COMPLETENATE 29-1 MG PO CHEW: 1.0000 | CHEWABLE_TABLET | Freq: Every day | ORAL | Status: AC

## 2024-01-20 NOTE — Progress Notes (Unsigned)
   Ultrasound f/u Patient name: Sheri Gilbert MRN [id number]  Date of birth: 1990/04/18 Chief Complaint:   Follow-up First trimester bleeding  History of Present Illness:   Sheri Gilbert is a 33 y.o. [phone] African-American female being seen today for follow up after being seen in the ER due to significant first trimester bleeding.  Pt reports she does not have any active red bleeding at this time.  Did pass some large clots but this has resolved.  Not just having dark, old looking blood.  Ultrasound on 10/27 showed subchorionic hemorrhage.    Ultrasound today shows good interval growth of embryo and an anterior [facility] that appears organized as well as a small ~1cm area of subchorionic bleeding as well.  Discussed with pt increased risks for miscarriage related to first trimester bleeding but feel ultrasound is reassuring given current findings.  Discussed typical bleeding after subchorionic hemorrhage and when to reach out or seek more emergent care.  She is on PNV at this time.    Has new ob appt scheduled already on 02/04/2024.    Patient's last menstrual period was 11/20/2023 (approximate).   Review of Systems:   Pertinent items are noted in HPI Denies any pelvic pain. Pertinent History Reviewed:  Reviewed past medical,surgical, social and family history.  Reviewed problem list, medications and allergies. Physical Assessment:   Vitals:   01/20/24 0829  BP: 114/73  Pulse: 100  SpO2: 100%  Weight: 170 lb (77.1 kg)  Body mass index is 31.09 kg/m.        Physical Examination:   General appearance - well appearing, and in no distress  Mental status - alert, oriented to person, place, and time  Psych:  She has a normal mood and affect     Assessment & Plan:  1. First trimester bleeding (Primary) - discussed ultrasound findings.  Good interval growth.  Dating c/w LMP.  Signs/symptoms for more emergent care discussed.  Advised to call with any concerns.    2. Anemia during  pregnancy in first trimester - Iron, Ferrous Sulfate, 325 (65 Fe) MG TABS; Take 1 tablet by mouth 3 (three) times a week.  3. Subchorionic hemorrhage of placenta in first trimester   No orders of the defined types were placed in this encounter.   Meds:  Meds ordered this encounter  Medications   prenatal vitamin w/FE, FA (NATACHEW) 29-1 MG CHEW chewable tablet    Sig: Chew 1 tablet by mouth daily at 12 noon.   Iron, Ferrous Sulfate, 325 (65 Fe) MG TABS    Sig: Take 1 tablet by mouth 3 (three) times a week.    Follow-up: Return in 15 days (on 02/04/2024) for new ob appt.  Ronal GORMAN Pinal, MD 01/21/2024 10:51 AM GYNECOLOGY  VISIT

## 2024-01-21 ENCOUNTER — Encounter (HOSPITAL_BASED_OUTPATIENT_CLINIC_OR_DEPARTMENT_OTHER): Payer: Self-pay | Admitting: Obstetrics & Gynecology

## 2024-01-21 DIAGNOSIS — O208 Other hemorrhage in early pregnancy: Secondary | ICD-10-CM | POA: Insufficient documentation

## 2024-02-04 ENCOUNTER — Encounter (HOSPITAL_BASED_OUTPATIENT_CLINIC_OR_DEPARTMENT_OTHER): Payer: Self-pay | Admitting: Certified Nurse Midwife

## 2024-02-04 ENCOUNTER — Ambulatory Visit (HOSPITAL_BASED_OUTPATIENT_CLINIC_OR_DEPARTMENT_OTHER): Admitting: Certified Nurse Midwife

## 2024-02-04 ENCOUNTER — Other Ambulatory Visit (HOSPITAL_COMMUNITY)
Admission: RE | Admit: 2024-02-04 | Discharge: 2024-02-04 | Disposition: A | Discharge status: Home / Self Care | Source: Ambulatory Visit | Attending: Certified Nurse Midwife | Admitting: Certified Nurse Midwife

## 2024-02-04 VITALS — BP 108/69 | HR 117 | Wt 171.6 lb

## 2024-02-04 DIAGNOSIS — O99211 Obesity complicating pregnancy, first trimester: Secondary | ICD-10-CM | POA: Diagnosis not present

## 2024-02-04 DIAGNOSIS — O34219 Maternal care for unspecified type scar from previous cesarean delivery: Secondary | ICD-10-CM

## 2024-02-04 DIAGNOSIS — Z8759 Personal history of other complications of pregnancy, childbirth and the puerperium: Secondary | ICD-10-CM | POA: Insufficient documentation

## 2024-02-04 DIAGNOSIS — Z3481 Encounter for supervision of other normal pregnancy, first trimester: Secondary | ICD-10-CM | POA: Insufficient documentation

## 2024-02-04 DIAGNOSIS — Z2821 Immunization not carried out because of patient refusal: Secondary | ICD-10-CM | POA: Diagnosis not present

## 2024-02-04 DIAGNOSIS — Z1331 Encounter for screening for depression: Secondary | ICD-10-CM

## 2024-02-04 DIAGNOSIS — E669 Obesity, unspecified: Secondary | ICD-10-CM

## 2024-02-04 DIAGNOSIS — Z3A10 10 weeks gestation of pregnancy: Secondary | ICD-10-CM | POA: Diagnosis not present

## 2024-02-04 DIAGNOSIS — Z3490 Encounter for supervision of normal pregnancy, unspecified, unspecified trimester: Secondary | ICD-10-CM | POA: Insufficient documentation

## 2024-02-04 DIAGNOSIS — O0990 Supervision of high risk pregnancy, unspecified, unspecified trimester: Secondary | ICD-10-CM | POA: Insufficient documentation

## 2024-02-04 DIAGNOSIS — O99210 Obesity complicating pregnancy, unspecified trimester: Secondary | ICD-10-CM | POA: Insufficient documentation

## 2024-02-04 MED ORDER — ASPIRIN 81 MG PO TBEC: 81.0000 mg | DELAYED_RELEASE_TABLET | Freq: Every day | ORAL | 12 refills | Status: AC

## 2024-02-04 NOTE — Progress Notes (Addendum)
 INITIAL PRENATAL VISIT  Subjective:   Sheri Gilbert is being seen today for her first obstetrical visit.  This is a planned pregnancy. This is a desired pregnancy.  She is at [redacted]w[redacted]d gestation by LMP confirmed by US .  Her obstetrical history is significant for Pt reports she developed gestational hypertension in 1st pregnancy.. Relationship with FOB: spouse, living together. Patient does intend to breast feed. Pregnancy history fully reviewed.  Patient reports no complaints and last episode of bleeding was 01/11/24..  Indications for ASA therapy (per uptodate) One of the following: Previous pregnancy with preeclampsia, especially early onset and with an adverse outcome Yes Multifetal gestation No Chronic hypertension No Type 1 or 2 diabetes mellitus No Chronic kidney disease No Autoimmune disease (antiphospholipid syndrome, systemic lupus erythematosus) No  Two or more of the following: Nulliparity No Obesity (body mass index >30 kg/m2) Yes Family history of preeclampsia in mother or sister  Age >=35 years No Sociodemographic characteristics (African American race, low socioeconomic level) Yes Personal risk factors (eg, previous pregnancy with low birth weight or small for gestational age infant, previous adverse pregnancy outcome [eg, stillbirth], interval >10 years between pregnancies) No   Review of Systems:   Review of Systems  Objective:    Obstetric History OB History  Gravida Para Term Preterm AB Living  5 2 2  2 2   SAB IAB Ectopic Multiple Live Births  2    2    # Outcome Date GA Lbr Len/2nd Weight Sex Type Anes PTL Lv  5 Current           4 Term 04/24/15 [redacted]w[redacted]d  8 lb (3.629 kg) F CS-LTranv EPI N LIV     Complications: Gestational hypertension  3 Term 12/28/12 [redacted]w[redacted]d  8 lb (3.629 kg) F CS-LTranv  N LIV     Complications: Failure to Progress in Second Stage  2 SAB 2013          1 SAB 2013            Past Medical History:  Diagnosis Date   Hypercholesteremia     Sickle cell trait     Past Surgical History:  Procedure Laterality Date   CESAREAN SECTION     CHOLECYSTECTOMY N/A 09/21/2019   Procedure: LAPAROSCOPIC CHOLECYSTECTOMY;  Surgeon: Sebastian Moles, MD;  [facility];  Service: General;  Laterality: N/A;    Current Outpatient Medications on File Prior to Visit  Medication Sig Dispense Refill   prenatal vitamin w/FE, FA (NATACHEW) 29-1 MG CHEW chewable tablet Chew 1 tablet by mouth daily at 12 noon.     Iron, Ferrous Sulfate, 325 (65 Fe) MG TABS Take 1 tablet by mouth 3 (three) times a week. (Patient not taking: Reported on 02/04/2024)     No current facility-administered medications on file prior to visit.    No Known Allergies  Social History:  reports that she has never smoked. She has never used smokeless tobacco. She reports that she does not currently use alcohol. She reports that she does not currently use drugs.  Family History  Problem Relation Age of Onset   Breast cancer Maternal Aunt     The following portions of the patient's history were reviewed and updated as appropriate: allergies, current medications, past family history, past medical history, past social history, past surgical history and problem list.  Review of Systems Review of Systems  Constitutional: Negative.   HENT: Negative.    Eyes: Negative.   Respiratory: Negative.  Cardiovascular: Negative.   Gastrointestinal: Negative.   Endocrine: Negative.   Genitourinary: Negative.  Negative for pelvic pain, vaginal bleeding and vaginal discharge.  Musculoskeletal: Negative.   Skin: Negative.   Allergic/Immunologic: Negative.   Neurological: Negative.   Hematological: Negative.   Psychiatric/Behavioral: Negative.        Physical Exam:  BP 108/69   Pulse (!) 117   Wt 171 lb 9.6 oz (77.8 kg)   LMP 11/20/2023 (Approximate)   BMI 31.39 kg/m  CONSTITUTIONAL: Well-developed, well-nourished female in no acute distress.  HENT:  Normocephalic,  atraumatic.  Oropharynx is clear and moist EYES: Conjunctivae normal. No scleral icterus.  NECK: Normal range of motion, supple, no masses.  Normal thyroid.  SKIN: Skin is warm and dry. No rash noted. Not diaphoretic. No erythema. No pallor. MUSCULOSKELETAL: Normal range of motion. No tenderness.  No cyanosis, clubbing, or edema.   NEUROLOGIC: Alert and oriented to person, place, and time. Normal muscle tone coordination.  PSYCHIATRIC: Normal mood and affect. Normal behavior. Normal judgment and thought content. CARDIOVASCULAR: Normal heart rate noted, regular rhythm RESPIRATORY: Clear to auscultation bilaterally. Effort and breath sounds normal, no problems with respiration noted. BREASTS: Plan breast exam and pelvic exam at next prenatal visit ABDOMEN: Soft, normal bowel sounds, no distention noted.  No tenderness, rebound or guarding.    Fetal Heart Rate (bpm): 170           Assessment:    Pregnancy: [id number]  1. Encounter for supervision of other normal pregnancy in first trimester (Primary) - Pt encouraged to start ASA 81mg  po once daily - Taking PNV Gummies daily - Pt considering permanent female sterilization - ABO/Rh - Antibody screen - CBC - Hepatitis B surface antigen - HIV Antibody (routine testing w rflx) - HIV (Save tube for possible reflex) - RPR - Rubella screen - Hepatitis C antibody - HgB A1c - Culture, OB Urine - Cervicovaginal ancillary only( [place]) - PANORAMA PRENATAL TEST - [place] Basic Panel - Comp Met (CMET) - Protein / creatinine ratio, urine  2. [redacted] weeks gestation of pregnancy   3. Previous cesarean delivery, antepartum - Pt considering that she may desire a trial of labor after Csx2 if possible. - Records requested (prior pregnancy/[facility] [place]) - ABO/Rh - Antibody screen - CBC - Hepatitis B surface antigen - HIV Antibody (routine testing w rflx) - HIV (Save tube for possible reflex) - RPR - Rubella screen -  Hepatitis C antibody - HgB A1c - Culture, OB Urine - Cervicovaginal ancillary only( [place]) - PANORAMA PRENATAL TEST - [place] Basic Panel - Comp Met (CMET) - Protein / creatinine ratio, urine - US  MFM OB DETAIL +14 WK; Future  4. History of gestational hypertension - ASA 81mg  po once daily  - Baseline PreE labs and urine prot/creat ratio - ABO/Rh - Antibody screen - CBC - Hepatitis B surface antigen - HIV Antibody (routine testing w rflx) - HIV (Save tube for possible reflex) - RPR - Rubella screen - Hepatitis C antibody - HgB A1c - Culture, OB Urine - Cervicovaginal ancillary only( [place]) - PANORAMA PRENATAL TEST - [place] Basic Panel - Comp Met (CMET) - Protein / creatinine ratio, urine - US  MFM OB DETAIL +14 WK; Future  5. Obesity in pregnancy - HgB A1c - US  MFM OB DETAIL +14 WK; Future  6. Flu vaccine declined - Flu vaccine trivalent PF, 6mos and older(Flulaval,Afluria,Fluarix,Fluzone)    Plan:    ASA 81mg  po once daily Initial labs  drawn. Prenatal vitamins. Problem list reviewed and updated. Reviewed in detail the nature of the practice with collaborative care between  Genetic screening discussed: NIPS/First trimester screen/Quad/AFP requested. Role of ultrasound in pregnancy discussed; Anatomy US : requested. Amniocentesis discussed: not indicated. Follow up in 4 weeks for return prenatal visit and physical exam (breast/pelvic) Weight gain recommendations per IOM guidelines reviewed: underweight/BMI 18.5 or less > 28 - 40 lbs; normal weight/BMI 18.5 - 24.9 > 25 - 35 lbs; overweight/BMI 25 - 29.9 > 15 - 25 lbs; obese/BMI  30 or more > 11 - 20 lbs.  Discussed clinic routines, schedule of care and testing, genetic screening options, involvement of students and residents under the direct supervision of APPs and doctors and presence of female providers. Pt verbalized understanding.   Tad Arland POUR, CNM 02/04/2024 10:03 AM

## 2024-02-05 ENCOUNTER — Ambulatory Visit (HOSPITAL_BASED_OUTPATIENT_CLINIC_OR_DEPARTMENT_OTHER): Payer: Self-pay | Admitting: Certified Nurse Midwife

## 2024-02-05 DIAGNOSIS — O99019 Anemia complicating pregnancy, unspecified trimester: Secondary | ICD-10-CM

## 2024-02-05 DIAGNOSIS — Z3481 Encounter for supervision of other normal pregnancy, first trimester: Secondary | ICD-10-CM

## 2024-02-05 LAB — CERVICOVAGINAL ANCILLARY ONLY
Chlamydia: NEGATIVE
Comment: NEGATIVE
Comment: NEGATIVE
Comment: NORMAL
Neisseria Gonorrhea: NEGATIVE
Trichomonas: NEGATIVE

## 2024-02-05 LAB — PROTEIN / CREATININE RATIO, URINE
Creatinine, Urine: 79.1 mg/dL
Protein, Ur: 7.7 mg/dL
Protein/Creat Ratio: 97 mg/g{creat} (ref 0–200)

## 2024-02-05 LAB — CULTURE, OB URINE

## 2024-02-05 LAB — URINE CULTURE, OB REFLEX

## 2024-02-06 LAB — CBC
Hematocrit: 33.1 % — ABNORMAL LOW (ref 34.0–46.6)
Hemoglobin: 10.7 g/dL — ABNORMAL LOW (ref 11.1–15.9)
MCH: 29.6 pg (ref 26.6–33.0)
MCHC: 32.3 g/dL (ref 31.5–35.7)
MCV: 91 fL (ref 79–97)
Platelets: 299 x10E3/uL (ref 150–450)
RBC: 3.62 x10E6/uL — ABNORMAL LOW (ref 3.77–5.28)
RDW: 13.6 % (ref 11.7–15.4)
WBC: 12.2 x10E3/uL — ABNORMAL HIGH (ref 3.4–10.8)

## 2024-02-06 LAB — ABO/RH: Rh Factor: POSITIVE

## 2024-02-06 LAB — ANTIBODY SCREEN: Antibody Screen: NEGATIVE

## 2024-02-06 LAB — SYPHILIS: RPR W/REFLEX TO RPR TITER AND TREPONEMAL ANTIBODIES, TRADITIONAL SCREENING AND DIAGNOSIS ALGORITHM: RPR Ser Ql: NONREACTIVE

## 2024-02-06 LAB — HEMOGLOBIN A1C
Est. average glucose Bld gHb Est-mCnc: 88 mg/dL
Hgb A1c MFr Bld: 4.7 % — ABNORMAL LOW (ref 4.8–5.6)

## 2024-02-06 LAB — HIV ANTIBODY (ROUTINE TESTING W REFLEX): HIV Screen 4th Generation wRfx: NONREACTIVE

## 2024-02-06 LAB — HEPATITIS C ANTIBODY: Hep C Virus Ab: NONREACTIVE

## 2024-02-06 LAB — HEPATITIS B SURFACE ANTIGEN: Hepatitis B Surface Ag: NEGATIVE

## 2024-02-06 LAB — RUBELLA SCREEN: Rubella Antibodies, IGG: <0.9 {index} — ABNORMAL LOW (ref >0.99)

## 2024-02-09 MED ORDER — FERROUS SULFATE 325 (65 FE) MG PO TABS: 325.0000 mg | ORAL_TABLET | ORAL | 3 refills | Status: DC

## 2024-02-11 LAB — PANORAMA PRENATAL TEST FULL PANEL:PANORAMA TEST PLUS 5 ADDITIONAL MICRODELETIONS: FETAL FRACTION: 13

## 2024-02-18 LAB — HORIZON CUSTOM: REPORT SUMMARY: POSITIVE — AB

## 2024-03-01 ENCOUNTER — Ambulatory Visit (INDEPENDENT_AMBULATORY_CARE_PROVIDER_SITE_OTHER): Payer: Self-pay | Admitting: Obstetrics and Gynecology

## 2024-03-01 VITALS — BP 120/67 | HR 108 | Wt 175.6 lb

## 2024-03-01 DIAGNOSIS — Z3A14 14 weeks gestation of pregnancy: Secondary | ICD-10-CM

## 2024-03-01 DIAGNOSIS — Z3481 Encounter for supervision of other normal pregnancy, first trimester: Secondary | ICD-10-CM

## 2024-03-01 DIAGNOSIS — O34219 Maternal care for unspecified type scar from previous cesarean delivery: Secondary | ICD-10-CM

## 2024-03-01 DIAGNOSIS — O09292 Supervision of pregnancy with other poor reproductive or obstetric history, second trimester: Secondary | ICD-10-CM | POA: Diagnosis not present

## 2024-03-01 DIAGNOSIS — Z8759 Personal history of other complications of pregnancy, childbirth and the puerperium: Secondary | ICD-10-CM | POA: Insufficient documentation

## 2024-03-01 NOTE — Progress Notes (Signed)
° °  PRENATAL VISIT NOTE  Subjective:  Sheri Gilbert is a 33 y.o. [id number] at [redacted]w[redacted]d being seen today for ongoing prenatal care.  She is currently monitored for the following issues for this high-risk pregnancy and has Cholecystitis with cholelithiasis; Subchorionic hemorrhage of placenta in first trimester; Hyperlipidemia; Thyroid disorder; Previous cesarean delivery, antepartum; Supervision of normal pregnancy; Obesity in pregnancy; and History of gestational hypertension on their problem list.  Patient reports had spotting over the weekend .  Contractions: Not present. Vag. Bleeding: Small (Spotting over the weekend).  Movement: Present. Denies leaking of fluid.   The following portions of the patient's history were reviewed and updated as appropriate: allergies, current medications, past family history, past medical history, past social history, past surgical history and problem list.   Objective:   Vitals:   03/01/24 1637  BP: 120/67  Pulse: (!) 108  Weight: 175 lb 9.6 oz (79.7 kg)    Fetal Status:  Fetal Heart Rate (bpm): 166   Movement: Present    General: Alert, oriented and cooperative. Patient is in no acute distress.  Skin: Skin is warm and dry. No rash noted.   Cardiovascular: Normal heart rate noted  Respiratory: Normal respiratory effort, no problems with respiration noted  Abdomen: Soft, gravid, appropriate for gestational age.  Pain/Pressure: Present (Pulling around the belly button area)     Pelvic: Cervical exam deferred        Extremities: Normal range of motion.  Edema: None  Mental Status: Normal mood and affect. Normal behavior. Normal judgment and thought content.   Assessment and Plan:  Pregnancy: [id number] at [redacted]w[redacted]d 1. Encounter for supervision of other normal pregnancy in first trimester (Primary) BP and FHR normal   2. Previous cesarean delivery, antepartum Considering TOLAC, will plan to sign consent another time  3. History of gestational hypertension BP  cuff provided today, notify if > 140s/90s  4. [redacted] weeks gestation of pregnancy Deos not desire BTL, interested in nexplanon  Bleeding precautions discussed  Anatomy u/s 1/20  5. History of postpartum hemorrhage Reports having PPH after first delivery, needed blood transfusion after delivery    Preterm labor symptoms and general obstetric precautions including but not limited to vaginal bleeding, contractions, leaking of fluid and fetal movement were reviewed in detail with the patient. Please refer to After Visit Summary for other counseling recommendations.    Future Appointments  Date Time Provider Department Center  04/05/2024  8:00 AM [facility] PROVIDER 1 [facility]  04/05/2024  8:30 AM [facility] US1 [facility]  04/05/2024  2:35 PM Cleotilde Ronal RAMAN, MD [facility] [id number]  05/03/2024  8:15 AM Delores Nidia CROME, FNP [facility] [id number]  05/31/2024  8:30 AM [facility] [address]  05/31/2024  9:35 AM Lo, Arland POUR, CNM [facility] [id number]  06/14/2024  8:15 AM Lo, Arland POUR, CNM [facility] [address]  06/28/2024  8:15 AM Lo, Arland POUR, CNM [facility] [address]  07/14/2024  8:15 AM Lo, Arland POUR, CNM [facility] [address]  07/27/2024  8:15 AM Cleotilde Ronal RAMAN, MD [facility] [address]  08/04/2024  8:15 AM Lo, Arland POUR, CNM [facility] [address]  08/11/2024  8:15 AM Lo, Arland POUR, CNM [facility] [id number]  08/18/2024  8:15 AM Lo, Arland POUR, CNM [facility] [phone] [address]    Nidia Delores, FNP

## 2024-03-08 ENCOUNTER — Encounter: Payer: Self-pay | Admitting: Emergency Medicine

## 2024-03-08 ENCOUNTER — Encounter (HOSPITAL_BASED_OUTPATIENT_CLINIC_OR_DEPARTMENT_OTHER): Payer: Self-pay

## 2024-03-08 ENCOUNTER — Telehealth (HOSPITAL_BASED_OUTPATIENT_CLINIC_OR_DEPARTMENT_OTHER): Payer: Self-pay

## 2024-03-08 ENCOUNTER — Ambulatory Visit: Admission: EM | Admit: 2024-03-08 | Discharge: 2024-03-08 | Disposition: A | Discharge status: Home / Self Care

## 2024-03-08 DIAGNOSIS — J069 Acute upper respiratory infection, unspecified: Secondary | ICD-10-CM | POA: Diagnosis not present

## 2024-03-08 LAB — POCT INFLUENZA A/B
Influenza A, POC: NEGATIVE
Influenza B, POC: NEGATIVE

## 2024-03-08 LAB — POC SOFIA SARS ANTIGEN FIA: SARS Coronavirus 2 Ag: NEGATIVE

## 2024-03-08 NOTE — ED Provider Notes (Signed)
 " [name]    CSN: [id number] Arrival date & time: 03/08/24  1735      History   Chief Complaint Chief Complaint  Patient presents with   Cough   Headache   Sore Throat    HPI Sheri Gilbert is a 33 y.o. female.   Pt  that is [redacted] weeks pregnant, presents today due to cough, throat pain, and nasal congestion since Saturday. Pt states that she used cough drops for symptoms with no significant relief. Pt denies fever, chills, or known sick contacts.   The history is provided by the patient.  Cough Associated symptoms: headaches   Headache Associated symptoms: cough   Sore Throat Associated symptoms include headaches.    Past Medical History:  Diagnosis Date   Hypercholesteremia    Sickle cell trait     Patient Active Problem List   Diagnosis Date Noted   History of postpartum hemorrhage 03/01/2024   Previous cesarean delivery, antepartum 02/04/2024   Supervision of normal pregnancy 02/04/2024   Obesity in pregnancy 02/04/2024   History of gestational hypertension 02/04/2024   Subchorionic hemorrhage of placenta in first trimester 01/21/2024   Hyperlipidemia 11/06/2021   Thyroid disorder 11/06/2021   Cholecystitis with cholelithiasis 09/20/2019    Past Surgical History:  Procedure Laterality Date   CESAREAN SECTION     CHOLECYSTECTOMY N/A 09/21/2019   Procedure: LAPAROSCOPIC CHOLECYSTECTOMY;  Surgeon: Sheri Moles, Gilbert;  Location: [facility];  Service: General;  Laterality: N/A;    OB History     Gravida  5   Para  2   Term  2   Preterm      AB  2   Living  2      SAB  2   IAB      Ectopic      Multiple      Live Births  2            Home Medications    Prior to Admission medications  Medication Sig Start Date End Date Taking? Authorizing Provider  aspirin  EC 81 MG tablet Take 1 tablet (81 mg total) by mouth daily. Dispense ASA 81mg  po once daily Swallow whole. 02/04/24  Yes Sheri Gilbert  ferrous sulfate  325 (65  FE) MG tablet Take 1 tablet (325 mg total) by mouth every other day. 02/09/24  Yes Sheri Gilbert  prenatal vitamin w/FE, FA (NATACHEW) 29-1 MG CHEW chewable tablet Chew 1 tablet by mouth daily at 12 noon. 01/20/24  Yes Sheri Gilbert  Iron , Ferrous Sulfate , 325 (65 Fe) MG TABS Take 1 tablet by mouth 3 (three) times a week. Patient not taking: Reported on 02/04/2024 01/20/24   Sheri Gilbert    Family History Family History  Problem Relation Age of Onset   Breast cancer Maternal Aunt     Social History Social History[1]   Allergies   Patient has no known allergies.   Review of Systems Review of Systems  Respiratory:  Positive for cough.   Neurological:  Positive for headaches.     Physical Exam Triage Vital Signs ED Triage Vitals [03/08/24 1754]  Encounter Vitals Group     BP 111/72     Girls Systolic BP Percentile      Girls Diastolic BP Percentile      Boys Systolic BP Percentile      Boys Diastolic BP Percentile      Pulse Rate 93     Resp  18     Temp 97.8 F (36.6 C)     Temp Source Oral     SpO2 96 %     Weight      Height      Head Circumference      Peak Flow      Pain Score      Pain Loc      Pain Education      Exclude from Growth Chart    No data found.  Updated Vital Signs BP 111/72 (BP Location: Left Arm)   Pulse 93   Temp 97.8 F (36.6 C) (Oral)   Resp 18   LMP 11/20/2023 (Approximate)   SpO2 96%   Visual Acuity Right Eye Distance:   Left Eye Distance:   Bilateral Distance:    Right Eye Near:   Left Eye Near:    Bilateral Near:     Physical Exam Vitals and nursing note reviewed.  Constitutional:      General: She is not in acute distress.    Appearance: Normal appearance. She is not ill-appearing, toxic-appearing or diaphoretic.  HENT:     Nose: Congestion (moderately enlarged turbinates) present. No rhinorrhea.     Mouth/Throat:     Mouth: Mucous membranes are moist.     Pharynx: Oropharynx is clear. No  oropharyngeal exudate or posterior oropharyngeal erythema.  Eyes:     General: No scleral icterus. Cardiovascular:     Rate and Rhythm: Normal rate and regular rhythm.     Heart sounds: Normal heart sounds.  Pulmonary:     Effort: Pulmonary effort is normal. No respiratory distress.     Breath sounds: Normal breath sounds. No wheezing or rhonchi.  Musculoskeletal:     Cervical back: No tenderness.  Lymphadenopathy:     Cervical: No cervical adenopathy.  Skin:    General: Skin is warm.  Neurological:     Mental Status: She is alert and oriented to person, place, and time.  Psychiatric:        Mood and Affect: Mood normal.        Behavior: Behavior normal.      UC Treatments / Results  Labs (all labs ordered are listed, but only abnormal results are displayed) Labs Reviewed  POCT INFLUENZA A/B - Normal  POC SOFIA SARS ANTIGEN FIA    EKG   Radiology No results found.  Procedures Procedures (including critical care time)  Medications Ordered in UC Medications - No data to display  Initial Impression / Assessment and Plan / UC Course  I have reviewed the triage vital signs and the nursing notes.  Pertinent labs & imaging results that were available during my care of the patient were reviewed by me and considered in my medical decision making (see chart for details).     Final Clinical Impressions(s) / UC Diagnoses   Final diagnoses:  Viral URI     Discharge Instructions      See handout attached about OTC meds safe in pregnancy    ED Prescriptions   None    PDMP not reviewed this encounter.    [1]  Social History Tobacco Use   Smoking status: Never   Smokeless tobacco: Never  Vaping Use   Vaping status: Never Used  Substance Use Topics   Alcohol use: Not Currently   Drug use: Not Currently     Sheri Gilbert 03/08/24 1820  "

## 2024-03-08 NOTE — Telephone Encounter (Signed)
 Patient called and spoke with [employer]. She asked if we could give her a call. Tried calling patient without success. Unable to leave message. No voicemail. tbw

## 2024-03-08 NOTE — Discharge Instructions (Signed)
 See handout attached about OTC meds safe in pregnancy

## 2024-03-08 NOTE — ED Triage Notes (Signed)
 Patient has a headache, sore throat, and cough that started Saturday.  Patient has not taken anything for it

## 2024-04-05 ENCOUNTER — Ambulatory Visit

## 2024-04-05 ENCOUNTER — Ambulatory Visit: Attending: Obstetrics and Gynecology | Admitting: Maternal & Fetal Medicine

## 2024-04-05 ENCOUNTER — Encounter (HOSPITAL_BASED_OUTPATIENT_CLINIC_OR_DEPARTMENT_OTHER): Admitting: Obstetrics & Gynecology

## 2024-04-05 ENCOUNTER — Ambulatory Visit: Attending: Obstetrics and Gynecology

## 2024-04-05 VITALS — BP 119/69 | HR 115

## 2024-04-05 DIAGNOSIS — Z363 Encounter for antenatal screening for malformations: Secondary | ICD-10-CM | POA: Diagnosis not present

## 2024-04-05 DIAGNOSIS — Z862 Personal history of diseases of the blood and blood-forming organs and certain disorders involving the immune mechanism: Secondary | ICD-10-CM | POA: Diagnosis not present

## 2024-04-05 DIAGNOSIS — Z8759 Personal history of other complications of pregnancy, childbirth and the puerperium: Secondary | ICD-10-CM

## 2024-04-05 DIAGNOSIS — D563 Thalassemia minor: Secondary | ICD-10-CM

## 2024-04-05 DIAGNOSIS — O99210 Obesity complicating pregnancy, unspecified trimester: Secondary | ICD-10-CM

## 2024-04-05 DIAGNOSIS — O99212 Obesity complicating pregnancy, second trimester: Secondary | ICD-10-CM

## 2024-04-05 DIAGNOSIS — O09292 Supervision of pregnancy with other poor reproductive or obstetric history, second trimester: Secondary | ICD-10-CM

## 2024-04-05 DIAGNOSIS — O34219 Maternal care for unspecified type scar from previous cesarean delivery: Secondary | ICD-10-CM

## 2024-04-05 DIAGNOSIS — D573 Sickle-cell trait: Secondary | ICD-10-CM | POA: Insufficient documentation

## 2024-04-05 DIAGNOSIS — O99012 Anemia complicating pregnancy, second trimester: Secondary | ICD-10-CM

## 2024-04-05 DIAGNOSIS — Z3A19 19 weeks gestation of pregnancy: Secondary | ICD-10-CM | POA: Diagnosis not present

## 2024-04-05 DIAGNOSIS — Z141 Cystic fibrosis carrier: Secondary | ICD-10-CM | POA: Diagnosis not present

## 2024-04-05 DIAGNOSIS — Z3689 Encounter for other specified antenatal screening: Secondary | ICD-10-CM | POA: Diagnosis not present

## 2024-04-05 DIAGNOSIS — Z148 Genetic carrier of other disease: Secondary | ICD-10-CM | POA: Diagnosis not present

## 2024-04-05 DIAGNOSIS — E669 Obesity, unspecified: Secondary | ICD-10-CM | POA: Diagnosis not present

## 2024-04-05 DIAGNOSIS — O321XX0 Maternal care for breech presentation, not applicable or unspecified: Secondary | ICD-10-CM | POA: Insufficient documentation

## 2024-04-05 DIAGNOSIS — Z7982 Long term (current) use of aspirin: Secondary | ICD-10-CM | POA: Diagnosis not present

## 2024-04-05 DIAGNOSIS — D5740 Sickle-cell thalassemia without crisis: Secondary | ICD-10-CM

## 2024-04-05 NOTE — Progress Notes (Signed)
 "  Patient information  Patient Name: Sheri Gilbert  Patient MRN:   [id number]  Referring practice: MFM Referring Provider: [facility]  Problem List   Patient Active Problem List   Diagnosis Date Noted   History of postpartum hemorrhage 03/01/2024   Previous cesarean delivery, antepartum 02/04/2024   Supervision of pregnancy 02/04/2024   Obesity in pregnancy 02/04/2024   History of gestational hypertension 02/04/2024   Subchorionic hemorrhage of placenta in first trimester 01/21/2024   Hyperlipidemia 11/06/2021   Cholecystitis with cholelithiasis 09/20/2019    Maternal Fetal Medicine Consult Sheri Gilbert is a 34 y.o. H4E7977 at [redacted]w[redacted]d here for ultrasound and consultation. She had low risk aneuploidy screening of a female fetus. Carrier screening was positive for sickle cell carrier. Maternal serum AFP is pending. She has no acute concerns.   Today we focused on the following:   The patient presents today for a detailed anatomy ultrasound. Review of todays study demonstrates that several fetal structures are suboptimally visualized. A repeat ultrasound is recommended in five weeks to complete the anatomic assessment.  Her obstetric history is notable for two prior pregnancies and deliveries. The first delivery was by cesarean section for failure to progress in the second stage of labor. The second pregnancy resulted in a repeat cesarean delivery, with delivery occurring at 40 weeks in the setting of gestational hypertension. We discussed options for delivery in the current pregnancy, including trial of labor after cesarean versus repeat cesarean delivery. The patient is contemplating a trial of labor. I encouraged her to continue discussing this with her primary OB provider, and we reviewed general risks and benefits of each approach.  She has a history of a subchorionic hemorrhage earlier in this pregnancy, which was associated with significant vaginal bleeding that has  since resolved. We discussed that this history may be associated with an increased risk of placental abruption or recurrent bleeding later in pregnancy. She was counseled to monitor closely and report any vaginal bleeding, abdominal pain, or contractions promptly.  Her medical history is notable for a possible thyroid disorder. She previously attempted treatment with levothyroxine (Synthroid) but discontinued the medication due to intolerable symptoms and has remained asymptomatic since discontinuation. She also has a history of cholelithiasis and is status post cholecystectomy, with no ongoing symptoms.  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 She will meet with genetic counseling following todays visit. The father of the baby has not yet been tested but is interested in undergoing carrier screening today.  The patient also meets criteria for obesity in pregnancy, with a pre-pregnancy BMI of 30. We discussed the importance of maintaining a healthy diet and engaging in regular exercise, with a goal of approximately 30 minutes per day as tolerated. Per our protocol, in the absence of additional indications, antenatal testing and serial growth ultrasounds are not routinely recommended for obesity alone.  She will return in five weeks for a repeat ultrasound to reassess fetal anatomy and evaluate fetal growth. The patient verbalizes understanding and agrees with the plan.  Sonographic findings Single intrauterine pregnancy at 19w 4d. Fetal cardiac activity:  Observed and appears normal. Presentation: Breech. The anatomic structures that were well seen appear normal. Due to poor acoustic windows some structures remain suboptimally visualized. Fetal biometry shows the estimated fetal weight of 0 lb 10 oz, 280 grams (26%). Amniotic fluid: Within normal limits.  MVP: 6.01 cm. Placenta: Anterior. Adnexa: No abnormality visualized. Cervical length: 3.6 cm.  RECOMMENDATIONS -Repeat ultrasound in  five  weeks to complete fetal anatomic survey and assess growth -Discuss trial of labor versus repeat cesarean delivery in detail with primary OB provider -Monitor closely for vaginal bleeding, abdominal pain, or contractions given prior subchorionic hemorrhage -Genetic counseling today for cystic fibrosis carrier status -Cystic fibrosis carrier testing for the father of the baby to be discussed with our genetic counselor today -Continue routine prenatal care and report any new or concerning symptoms promptly  60 minutes of time was spent reviewing the patient's chart including labs, imaging and documentation.  At least 50% of this time was spent with direct patient care discussing the diagnosis, management and prognosis of her care.  Review of Systems: A review of systems was performed and was negative except per HPI   Past Obstetrical History:  OB History  Gravida Para Term Preterm AB Living  5 2 2  2 2   SAB IAB Ectopic Multiple Live Births  2    2    # Outcome Date GA Lbr Len/2nd Weight Sex Type Anes PTL Lv  5 Current           4 Term 04/24/15 [redacted]w[redacted]d  8 lb (3.629 kg) F CS-LTranv EPI N LIV     Complications: Gestational hypertension  3 Term 12/28/12 [redacted]w[redacted]d  8 lb (3.629 kg) F CS-LTranv  N LIV     Complications: Failure to Progress in Second Stage  2 SAB 2013          1 SAB 2013             Past Medical History:  Past Medical History:  Diagnosis Date   Hypercholesteremia    Sickle cell trait    Thyroid disorder 11/06/2021     Past Surgical History:    Past Surgical History:  Procedure Laterality Date   ABDOMINOPLASTY  2022   CESAREAN SECTION     CHOLECYSTECTOMY N/A 09/21/2019   Procedure: LAPAROSCOPIC CHOLECYSTECTOMY;  Surgeon: Sebastian Moles, MD;  Location: [facility] OR;  Service: General;  Laterality: N/A;   LIPOSUCTION  2019     Home Medications:   Medications Ordered Prior to Encounter[1]    Allergies:   Allergies[2]   Physical Exam:   Vitals:   04/05/24 0758  BP:  119/69  Pulse: (!) 115   Sitting comfortably on the sonogram table Nonlabored breathing Normal rate and rhythm Abdomen is nontender  Thank you for the opportunity to be involved with this patient's care. Please let us  know if we can be of any further assistance.   Delora Smaller MFM, [place]   04/05/2024  9:51 AM      [1]  Current Outpatient Medications on File Prior to Visit  Medication Sig Dispense Refill   aspirin  EC 81 MG tablet Take 1 tablet (81 mg total) by mouth daily. Dispense ASA 81mg  po once daily Swallow whole. 30 tablet 12   ferrous sulfate  325 (65 FE) MG tablet Take 1 tablet (325 mg total) by mouth every other day. 15 tablet 3   prenatal vitamin w/FE, FA (NATACHEW) 29-1 MG CHEW chewable tablet Chew 1 tablet by mouth daily at 12 noon.     Iron , Ferrous Sulfate , 325 (65 Fe) MG TABS Take 1 tablet by mouth 3 (three) times a week. (Patient not taking: Reported on 02/04/2024)     No current facility-administered medications on file prior to visit.  [2] No Known Allergies  "

## 2024-04-05 NOTE — Progress Notes (Signed)
 "    [facility] for M[facility] at [facility] [address], [address] Phone:  [phone]   Fax:  [phone]      In-Person Genetic Counseling Clinic Note:   I spoke with 34 y.o. Sheri Gilbert today to discuss her carrier screening results. She was referred by Sheri Gilbert, CN. She was accompanied by FOB Sheri Gilbert.   Pregnancy History:    [id number]. EGA: [redacted]w[redacted]d by LMP. EDD: 08/26/2024. Sheri Gilbert has two healthy daughters. She had 2 SABs of unknown etiology. Denies major personal health concerns. Denies bleeding, infections, and fevers in this pregnancy. Denies using tobacco, alcohol, or street drugs in this pregnancy.   Family History:    A three-generation pedigree was created and scanned into Epic under the Media tab.  Patient ethnicity reported as Black and FOB ethnicity reported as Black. Denies Ashkenazi Jewish ancestry.  Family history not remarkable for consanguinity, individuals with birth defects, intellectual disability, autism spectrum disorder, multiple spontaneous abortions, still births, or unexplained neonatal death.   Maternal Sickle Cell Trait and Silent Carrier for Alpha Thalassemia:   Sheri Gilbert had [employer] carrier screening. She was found to be a carrier for two conditions. She was not found to be a carrier for the other two conditions screened for (CF and SMA) which significantly reduces but does not eliminate the chance of being a carrier. Please see report for residual risk information.   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  We reviewed the genetics of alpha thalassemia, autosomal recessive mode of inheritance, and clinical features of these conditions. We reviewed that Sheri Gilbert will either pass down two copies of the alpha globin gene (??) OR one copy of the alpha globin gene (-?) in each pregnancy. Therefore, this pregnancy is  not at increased risk for hemoglobin Bart's due to four deletions of the alpha globin genes (--/--) regardless of her reproductive partner's carrier status. The pregnancy will be at increased risk (25%) for hemoglobin H disease if her partner is an alpha thalassemia carrier in the cis configuration (--/??). We reviewed that this is more common in Southeast Asian populations and less common in those with Black ancestry.  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 Therefore, she makes both the typical hemoglobin A and the atypical hemoglobin S (HbA/S). This is typically not associated with symptoms in the carrier.   We reviewed beta globin genes, hemoglobin, forms of beta-hemoglobinopathies and their natural histories, and the autosomal mode of inheritance. Sheri Gilbert will either pass down either the normal beta globin gene that produces the normal hemoglobin A or the altered beta globin gene that produces hemoglobin S. There would be a 25% risk for the pregnancy to be affected with sickle cell disease (HbS/S) if Sheri Gilbert's reproductive partner is also a carrier. There are several types of beta-hemoglobinopathies, including hemoglobin C, hemoglobin O, hemoglobin E, and beta-thalassemia. If Sheri Gilbert's reproductive partner is found to be a carrier for for a beta-hemoglobinopathy, there would be a 25% chance that this pregnancy would be affected. The type of beta-hemoglobinopathy and symptoms will vary depending on the genotype.   Given these results, we discussed and offered carrier screening for Sheri Gilbert's reproductive partner. We reviewed the benefits and limitations of carrier screening and that it can detect most  but not all carriers.  The couple consented carrier screening for FOB. His blood was drawn during today's visit. He consented that we call Sheri Gilbert with the results. We reviewed that if both were  found to be carriers, prenatal diagnosis through amniocentesis would be available. We reviewed the technical aspects, benefits, risks, and limitations of amniocentesis including the 1 in 500 risk for miscarriage. Alternatively, testing can be completed postnatally. Of note, alpha thalassemia is not included on Sheri Gilbert 's Newborn Screening (NBS) program.  Newborn Screening. The Sheri Gilbert  Newborn Screening (NBS) program will screen all newborn babies for cystic fibrosis, spinal muscular atrophy, hemoglobinopathies, and numerous other conditions.  Previous Testing Completed:  Low risk NIPS: Sheri Gilbert previously completed [facility] noninvasive prenatal screening (NIPS) in this pregnancy. The result is low risk, consistent with a female fetus. This screening significantly reduces but does not eliminate the chance that the current pregnancy has Down syndrome (trisomy 41), trisomy 30, trisomy 37, and common sex chromosome conditions. Please see report for residual risk information. There are many genetic conditions that cannot be detected by NIPS.    Plan of Care:   FOB carrier screening for alpha thalassemia and beta-hemoglobinopathies including sickle cell was collected today. We will communicate the results to the patient. Declined amniocentesis. Follow-up [facility] ultrasound in 05/10/2024.   Informed consent was obtained. All questions were answered.   50 minutes were spent on the date of the encounter in service to the patient including preparation, face-to-face consultation, discussion of test reports and available next steps, pedigree construction, genetic risk assessment, documentation, and care coordination.    Thank you for sharing in the care of Sheri Gilbert with us .  Please do not hesitate to contact us  at [phone] if you have any questions.   Sheri Bodily, MS, [facility] Certified Genetic Counselor   Genetic counseling student involved in appointment: No. "

## 2024-04-06 ENCOUNTER — Ambulatory Visit (INDEPENDENT_AMBULATORY_CARE_PROVIDER_SITE_OTHER): Admitting: Obstetrics & Gynecology

## 2024-04-06 ENCOUNTER — Encounter (HOSPITAL_BASED_OUTPATIENT_CLINIC_OR_DEPARTMENT_OTHER): Payer: Self-pay | Admitting: Obstetrics & Gynecology

## 2024-04-06 VITALS — BP 116/81 | HR 102 | Wt 176.6 lb

## 2024-04-06 DIAGNOSIS — D563 Thalassemia minor: Secondary | ICD-10-CM

## 2024-04-06 DIAGNOSIS — O34219 Maternal care for unspecified type scar from previous cesarean delivery: Secondary | ICD-10-CM

## 2024-04-06 DIAGNOSIS — D5740 Sickle-cell thalassemia without crisis: Secondary | ICD-10-CM

## 2024-04-06 DIAGNOSIS — Z3A19 19 weeks gestation of pregnancy: Secondary | ICD-10-CM

## 2024-04-06 DIAGNOSIS — Z8759 Personal history of other complications of pregnancy, childbirth and the puerperium: Secondary | ICD-10-CM

## 2024-04-06 DIAGNOSIS — O99112 Other diseases of the blood and blood-forming organs and certain disorders involving the immune mechanism complicating pregnancy, second trimester: Secondary | ICD-10-CM | POA: Diagnosis not present

## 2024-04-06 DIAGNOSIS — Z3402 Encounter for supervision of normal first pregnancy, second trimester: Secondary | ICD-10-CM

## 2024-04-06 DIAGNOSIS — Z9889 Other specified postprocedural states: Secondary | ICD-10-CM | POA: Diagnosis not present

## 2024-04-06 DIAGNOSIS — D573 Sickle-cell trait: Secondary | ICD-10-CM

## 2024-04-07 LAB — AFP, SERUM, OPEN SPINA BIFIDA
AFP MoM: 1.16
AFP Value: 60.2 ng/mL
Gest. Age on Collection Date: 19.5 wk
Maternal Age At EDD: 32.9 a
OSBR Risk 1 IN: 10000
Test Results:: NEGATIVE
Weight: 175 [lb_av]

## 2024-04-09 ENCOUNTER — Ambulatory Visit (HOSPITAL_BASED_OUTPATIENT_CLINIC_OR_DEPARTMENT_OTHER): Payer: Self-pay | Admitting: Obstetrics & Gynecology

## 2024-04-09 DIAGNOSIS — O0992 Supervision of high risk pregnancy, unspecified, second trimester: Secondary | ICD-10-CM

## 2024-04-09 NOTE — Progress Notes (Signed)
 "  PRENATAL VISIT NOTE  Subjective:  Sheri Gilbert is a 34 y.o. H4E7977 at [redacted]w[redacted]d being seen today for ongoing prenatal care.  She is currently monitored for the following issues for this low-risk pregnancy and has Subchorionic hemorrhage of placenta in first trimester; Hyperlipidemia; Previous cesarean delivery, antepartum; Supervision of pregnancy; Obesity in pregnancy; History of gestational hypertension; History of postpartum hemorrhage; Sickle cell trait; Alpha thalassemia silent carrier; and History of abdominoplasty on their problem list.  Patient reports no complaints.  Contractions: Not present. Vag. Bleeding: None.  Movement: Present. Denies leaking of fluid.   Discussed TOL today.  She really would like to try for a vaginal delivery.  Discussed reasons she would not be able to try, specifically if needed induction other than minimal help with getting labor started.  Also discussed better success rate if does not need induction and goes into labor on own.  TOLAC form discussed.  Will sign at 28 week appt.    The following portions of the patient's history were reviewed and updated as appropriate: allergies, current medications, past family history, past medical history, past social history, past surgical history and problem list.   Objective:   Vitals:   04/06/24 0839  BP: 116/81  Pulse: (!) 102  Weight: 176 lb 9.6 oz (80.1 kg)    Fetal Status:  Fetal Heart Rate (bpm): 141 Fundal Height: 20 cm Movement: Present    General: Alert, oriented and cooperative. Patient is in no acute distress.  Skin: Skin is warm and dry. No rash noted.   Cardiovascular: Normal heart rate noted  Respiratory: Normal respiratory effort, no problems with respiration noted  Abdomen: Soft, gravid, appropriate for gestational age.  Pain/Pressure: Absent     Pelvic: Cervical exam deferred        Extremities: Normal range of motion.  Edema: None  Mental Status: Normal mood and affect. Normal behavior.  Normal judgment and thought content.      02/04/2024    9:18 AM  Depression screen PHQ 2/9  Decreased Interest 0  Down, Depressed, Hopeless 0  PHQ - 2 Score 0  Altered sleeping 0  Tired, decreased energy 1  Change in appetite 0  Feeling bad or failure about yourself  0  Trouble concentrating 0  Moving slowly or fidgety/restless 0  Suicidal thoughts 0  PHQ-9 Score 1        02/04/2024    9:19 AM  GAD 7 : Generalized Anxiety Score  Nervous, Anxious, on Edge 0   Control/stop worrying 0   Worry too much - different things 0   Trouble relaxing 0   Restless 0   Easily annoyed or irritable 0   Afraid - awful might happen 0   Total GAD 7 Score 0  Anxiety Difficulty Not difficult at all     Data saved with a previous flowsheet row definition    Assessment and Plan:  Pregnancy: H4E7977 at [redacted]w[redacted]d 1. Encounter for supervision of normal first pregnancy in second trimester (Primary) - on PNV and baby ASA - recheck 4 weeks - anatomy scan done 1/20.  F/u to complete anatomy scheduled  2. [redacted] weeks gestation of pregnancy - AFP, Serum, Open Spina Bifida  3. Sickle cell trait - FOB testing pending  4. Previous cesarean delivery, antepartum  5. Alpha thalassemia silent carrier - FOB testing pending  6. History of abdominoplasty  7. History of postpartum hemorrhage  8. History of gestational hypertension   Preterm labor symptoms and general obstetric  precautions including but not limited to vaginal bleeding, contractions, leaking of fluid and fetal movement were reviewed in detail with the patient. Please refer to After Visit Summary for other counseling recommendations.   Return in about 4 weeks (around 05/04/2024).  Future Appointments  Date Time Provider Department Center  05/03/2024  8:15 AM Delores Nidia CROME, FNP [facility] [address] [facility]  05/10/2024  1:15 PM [facility] PROVIDER 1 [facility]  05/10/2024  1:30 PM [facility] US4 [facility]  05/31/2024  8:30 AM [facility] LAB  [facility]  05/31/2024  9:35 AM Delores Nidia CROME, FNP [facility] [facility]  06/14/2024  8:15 AM Leftwich-Kirby, Olam LABOR, CNM [facility] [facility]  06/28/2024  8:15 AM Lo, Arland POUR, CNM [facility] [facility]  07/14/2024  8:15 AM Delores Nidia CROME, FNP [facility]  07/27/2024  8:15 AM Cleotilde Ronal RAMAN, MD [facility] [facility]  08/04/2024  8:15 AM Lo, Arland POUR, CNM [facility]  08/11/2024  8:15 AM Lo, Arland POUR, CNM [facility]  08/18/2024  8:15 AM Lo, Arland POUR, CNM [facility]  08/23/2024  8:15 AM Lo, Arland POUR, CNM [facility] [phone] [facility]    Ronal RAMAN Cleotilde, MD  "

## 2024-04-13 ENCOUNTER — Telehealth: Payer: Self-pay

## 2024-04-13 NOTE — Telephone Encounter (Signed)
 Attempted to call patient with FOB's negative carrier screening results. Unable to leave voicemail.

## 2024-04-19 ENCOUNTER — Other Ambulatory Visit (HOSPITAL_BASED_OUTPATIENT_CLINIC_OR_DEPARTMENT_OTHER): Payer: Self-pay

## 2024-04-19 DIAGNOSIS — O26899 Other specified pregnancy related conditions, unspecified trimester: Secondary | ICD-10-CM

## 2024-04-19 MED ORDER — OMEPRAZOLE 20 MG PO CPDR: 20.0000 mg | DELAYED_RELEASE_CAPSULE | Freq: Every day | ORAL | 2 refills | Status: AC

## 2024-05-03 ENCOUNTER — Encounter (HOSPITAL_BASED_OUTPATIENT_CLINIC_OR_DEPARTMENT_OTHER): Admitting: Obstetrics and Gynecology

## 2024-05-10 ENCOUNTER — Ambulatory Visit

## 2024-05-12 ENCOUNTER — Ambulatory Visit

## 2024-05-12 ENCOUNTER — Other Ambulatory Visit

## 2024-05-31 ENCOUNTER — Other Ambulatory Visit (HOSPITAL_BASED_OUTPATIENT_CLINIC_OR_DEPARTMENT_OTHER): Payer: Self-pay

## 2024-05-31 ENCOUNTER — Encounter (HOSPITAL_BASED_OUTPATIENT_CLINIC_OR_DEPARTMENT_OTHER): Payer: Self-pay | Admitting: Obstetrics and Gynecology

## 2024-06-14 ENCOUNTER — Encounter (HOSPITAL_BASED_OUTPATIENT_CLINIC_OR_DEPARTMENT_OTHER): Payer: Self-pay | Admitting: Advanced Practice Midwife

## 2024-06-28 ENCOUNTER — Encounter (HOSPITAL_BASED_OUTPATIENT_CLINIC_OR_DEPARTMENT_OTHER): Payer: Self-pay | Admitting: Obstetrics and Gynecology

## 2024-07-14 ENCOUNTER — Encounter (HOSPITAL_BASED_OUTPATIENT_CLINIC_OR_DEPARTMENT_OTHER): Payer: Self-pay | Admitting: Obstetrics and Gynecology

## 2024-07-27 ENCOUNTER — Encounter (HOSPITAL_BASED_OUTPATIENT_CLINIC_OR_DEPARTMENT_OTHER): Payer: Self-pay | Admitting: Obstetrics & Gynecology

## 2024-08-04 ENCOUNTER — Encounter (HOSPITAL_BASED_OUTPATIENT_CLINIC_OR_DEPARTMENT_OTHER): Payer: Self-pay | Admitting: Certified Nurse Midwife

## 2024-08-11 ENCOUNTER — Encounter (HOSPITAL_BASED_OUTPATIENT_CLINIC_OR_DEPARTMENT_OTHER): Payer: Self-pay | Admitting: Certified Nurse Midwife

## 2024-08-18 ENCOUNTER — Encounter (HOSPITAL_BASED_OUTPATIENT_CLINIC_OR_DEPARTMENT_OTHER): Payer: Self-pay | Admitting: Certified Nurse Midwife

## 2024-08-23 ENCOUNTER — Encounter (HOSPITAL_BASED_OUTPATIENT_CLINIC_OR_DEPARTMENT_OTHER): Payer: Self-pay | Admitting: Certified Nurse Midwife

## 2024-08-26 ENCOUNTER — Inpatient Hospital Stay (HOSPITAL_COMMUNITY): Admit: 2024-08-26
